# Patient Record
Sex: Female | Born: 1960 | Race: Black or African American | Hispanic: No | Marital: Single | Smoking: Never smoker
Health system: Southern US, Community
[De-identification: ages and names within clinical notes are randomized; demographics above are authoritative.]

## PROBLEM LIST (undated history)

## (undated) DIAGNOSIS — M5134 Other intervertebral disc degeneration, thoracic region: Secondary | ICD-10-CM

## (undated) DIAGNOSIS — D219 Benign neoplasm of connective and other soft tissue, unspecified: Secondary | ICD-10-CM

## (undated) DIAGNOSIS — I10 Essential (primary) hypertension: Secondary | ICD-10-CM

## (undated) DIAGNOSIS — M5124 Other intervertebral disc displacement, thoracic region: Secondary | ICD-10-CM

## (undated) DIAGNOSIS — Z86718 Personal history of other venous thrombosis and embolism: Secondary | ICD-10-CM

## (undated) DIAGNOSIS — G4730 Sleep apnea, unspecified: Secondary | ICD-10-CM

## (undated) DIAGNOSIS — G35 Multiple sclerosis: Secondary | ICD-10-CM

## (undated) HISTORY — DX: Multiple sclerosis: G35

## (undated) HISTORY — DX: Sleep apnea, unspecified: G47.30

## (undated) HISTORY — DX: Personal history of other venous thrombosis and embolism: Z86.718

## (undated) HISTORY — DX: Other intervertebral disc degeneration, thoracic region: M51.34

## (undated) HISTORY — DX: Other intervertebral disc displacement, thoracic region: M51.24

## (undated) HISTORY — DX: Benign neoplasm of connective and other soft tissue, unspecified: D21.9

## (undated) HISTORY — DX: Essential (primary) hypertension: I10

---

## 2007-06-10 ENCOUNTER — Ambulatory Visit: Payer: Self-pay | Admitting: Internal Medicine

## 2011-01-15 DIAGNOSIS — G4733 Obstructive sleep apnea (adult) (pediatric): Secondary | ICD-10-CM | POA: Insufficient documentation

## 2011-04-22 DIAGNOSIS — R809 Proteinuria, unspecified: Secondary | ICD-10-CM | POA: Insufficient documentation

## 2011-05-10 DIAGNOSIS — H524 Presbyopia: Secondary | ICD-10-CM | POA: Insufficient documentation

## 2011-10-27 DIAGNOSIS — E785 Hyperlipidemia, unspecified: Secondary | ICD-10-CM | POA: Insufficient documentation

## 2012-11-20 DIAGNOSIS — R29898 Other symptoms and signs involving the musculoskeletal system: Secondary | ICD-10-CM | POA: Insufficient documentation

## 2013-01-03 DIAGNOSIS — Z86718 Personal history of other venous thrombosis and embolism: Secondary | ICD-10-CM | POA: Insufficient documentation

## 2014-02-12 DIAGNOSIS — Z860100 Personal history of colon polyps, unspecified: Secondary | ICD-10-CM | POA: Insufficient documentation

## 2015-02-08 LAB — HM COLONOSCOPY

## 2015-07-28 DIAGNOSIS — M79605 Pain in left leg: Secondary | ICD-10-CM | POA: Insufficient documentation

## 2015-07-28 DIAGNOSIS — M545 Low back pain: Secondary | ICD-10-CM | POA: Insufficient documentation

## 2015-08-16 HISTORY — PX: COLONOSCOPY: SHX174

## 2017-04-15 DIAGNOSIS — E559 Vitamin D deficiency, unspecified: Secondary | ICD-10-CM | POA: Insufficient documentation

## 2017-04-15 DIAGNOSIS — G35 Multiple sclerosis: Secondary | ICD-10-CM | POA: Insufficient documentation

## 2017-06-23 LAB — HM HEPATITIS C SCREENING LAB: HM Hepatitis Screen: NEGATIVE

## 2017-06-23 LAB — HM HIV SCREENING LAB: HM HIV SCREENING: NEGATIVE

## 2017-09-25 DIAGNOSIS — D500 Iron deficiency anemia secondary to blood loss (chronic): Secondary | ICD-10-CM | POA: Insufficient documentation

## 2017-09-25 DIAGNOSIS — D6859 Other primary thrombophilia: Secondary | ICD-10-CM | POA: Insufficient documentation

## 2017-11-30 DIAGNOSIS — D251 Intramural leiomyoma of uterus: Secondary | ICD-10-CM | POA: Insufficient documentation

## 2018-01-22 ENCOUNTER — Ambulatory Visit: Payer: BC Managed Care – PPO | Admitting: Family Medicine

## 2018-01-22 ENCOUNTER — Encounter

## 2018-01-22 ENCOUNTER — Encounter: Payer: Self-pay | Admitting: Family Medicine

## 2018-01-22 DIAGNOSIS — M5441 Lumbago with sciatica, right side: Secondary | ICD-10-CM | POA: Diagnosis not present

## 2018-01-22 DIAGNOSIS — M5442 Lumbago with sciatica, left side: Secondary | ICD-10-CM | POA: Diagnosis not present

## 2018-01-22 DIAGNOSIS — Z1231 Encounter for screening mammogram for malignant neoplasm of breast: Secondary | ICD-10-CM

## 2018-01-22 DIAGNOSIS — R202 Paresthesia of skin: Secondary | ICD-10-CM | POA: Diagnosis not present

## 2018-01-22 DIAGNOSIS — D500 Iron deficiency anemia secondary to blood loss (chronic): Secondary | ICD-10-CM | POA: Diagnosis not present

## 2018-01-22 DIAGNOSIS — G35 Multiple sclerosis: Secondary | ICD-10-CM

## 2018-01-22 DIAGNOSIS — E559 Vitamin D deficiency, unspecified: Secondary | ICD-10-CM

## 2018-01-22 DIAGNOSIS — R7303 Prediabetes: Secondary | ICD-10-CM | POA: Insufficient documentation

## 2018-01-22 DIAGNOSIS — D6859 Other primary thrombophilia: Secondary | ICD-10-CM

## 2018-01-22 DIAGNOSIS — I10 Essential (primary) hypertension: Secondary | ICD-10-CM | POA: Insufficient documentation

## 2018-01-22 DIAGNOSIS — E785 Hyperlipidemia, unspecified: Secondary | ICD-10-CM

## 2018-01-22 DIAGNOSIS — R739 Hyperglycemia, unspecified: Secondary | ICD-10-CM

## 2018-01-22 DIAGNOSIS — G8929 Other chronic pain: Secondary | ICD-10-CM

## 2018-01-22 DIAGNOSIS — Z6841 Body Mass Index (BMI) 40.0 and over, adult: Secondary | ICD-10-CM

## 2018-01-22 MED ORDER — IBUPROFEN 800 MG PO TABS: 800.0000 mg | ORAL_TABLET | Freq: Three times a day (TID) | ORAL | 0 refills | Status: DC | PRN

## 2018-01-22 MED ORDER — LOSARTAN POTASSIUM 25 MG PO TABS: 25.0000 mg | ORAL_TABLET | Freq: Every day | ORAL | 1 refills | Status: DC

## 2018-01-22 NOTE — Patient Instructions (Signed)
Obesity, Adult  Obesity is the condition of having too much total body fat. Being overweight or obese means that your weight is greater than what is considered healthy for your body size. Obesity is determined by a measurement called BMI. BMI is an estimate of body fat and is calculated from height and weight. For adults, a BMI of 30 or higher is considered obese.  Obesity can eventually lead to other health concerns and major illnesses, including:  · Stroke.  · Coronary artery disease (CAD).  · Type 2 diabetes.  · Some types of cancer, including cancers of the colon, breast, uterus, and gallbladder.  · Osteoarthritis.  · High blood pressure (hypertension).  · High cholesterol.  · Sleep apnea.  · Gallbladder stones.  · Infertility problems.    What are the causes?  The main cause of obesity is taking in (consuming) more calories than your body uses for energy. Other factors that contribute to this condition may include:  · Being born with genes that make you more likely to become obese.  · Having a medical condition that causes obesity. These conditions include:  ? Hypothyroidism.  ? Polycystic ovarian syndrome (PCOS).  ? Binge-eating disorder.  ? Cushing syndrome.  · Taking certain medicines, such as steroids, antidepressants, and seizure medicines.  · Not being physically active (sedentary lifestyle).  · Living where there are limited places to exercise safely or buy healthy foods.  · Not getting enough sleep.    What increases the risk?  The following factors may increase your risk of this condition:  · Having a family history of obesity.  · Being a woman of African-American descent.  · Being a man of Hispanic descent.    What are the signs or symptoms?  Having excessive body fat is the main symptom of this condition.  How is this diagnosed?  This condition may be diagnosed based on:  · Your symptoms.  · Your medical history.  · A physical exam. Your health care provider may measure:  ? Your BMI. If you are an  adult with a BMI between 25 and less than 30, you are considered overweight. If you are an adult with a BMI of 30 or higher, you are considered obese.  ? The distances around your hips and your waist (circumferences). These may be compared to each other to help diagnose your condition.  ? Your skinfold thickness. Your health care provider may gently pinch a fold of your skin and measure it.    How is this treated?  Treatment for this condition often includes changing your lifestyle. Treatment may include some or all of the following:  · Dietary changes. Work with your health care provider and a dietitian to set a weight-loss goal that is healthy and reasonable for you. Dietary changes may include eating:  ? Smaller portions. A portion size is the amount of a particular food that is healthy for you to eat at one time. This varies from person to person.  ? Low-calorie or low-fat options.  ? More whole grains, fruits, and vegetables.  · Regular physical activity. This may include aerobic activity (cardio) and strength training.  · Medicine to help you lose weight. Your health care provider may prescribe medicine if you are unable to lose 1 pound a week after 6 weeks of eating more healthily and doing more physical activity.  · Surgery. Surgical options may include gastric banding and gastric bypass. Surgery may be done if:  ? Other   treatments have not helped to improve your condition.  ? You have a BMI of 40 or higher.  ? You have life-threatening health problems related to obesity.    Follow these instructions at home:    Eating and drinking    · Follow recommendations from your health care provider about what you eat and drink. Your health care provider may advise you to:  ? Limit fast foods, sweets, and processed snack foods.  ? Choose low-fat options, such as low-fat milk instead of whole milk.  ? Eat 5 or more servings of fruits or vegetables every day.  ? Eat at home more often. This gives you more control over  what you eat.  ? Choose healthy foods when you eat out.  ? Learn what a healthy portion size is.  ? Keep low-fat snacks on hand.  ? Avoid sugary drinks, such as soda, fruit juice, iced tea sweetened with sugar, and flavored milk.  ? Eat a healthy breakfast.  · Drink enough water to keep your urine clear or pale yellow.  · Do not go without eating for long periods of time (do not fast) or follow a fad diet. Fasting and fad diets can be unhealthy and even dangerous.  Physical Activity  · Exercise regularly, as told by your health care provider. Ask your health care provider what types of exercise are safe for you and how often you should exercise.  · Warm up and stretch before being active.  · Cool down and stretch after being active.  · Rest between periods of activity.  Lifestyle  · Limit the time that you spend in front of your TV, computer, or video game system.  · Find ways to reward yourself that do not involve food.  · Limit alcohol intake to no more than 1 drink a day for nonpregnant women and 2 drinks a day for men. One drink equals 12 oz of beer, 5 oz of wine, or 1½ oz of hard liquor.  General instructions  · Keep a weight loss journal to keep track of the food you eat and how much you exercise you get.  · Take over-the-counter and prescription medicines only as told by your health care provider.  · Take vitamins and supplements only as told by your health care provider.  · Consider joining a support group. Your health care provider may be able to recommend a support group.  · Keep all follow-up visits as told by your health care provider. This is important.  Contact a health care provider if:  · You are unable to meet your weight loss goal after 6 weeks of dietary and lifestyle changes.  This information is not intended to replace advice given to you by your health care provider. Make sure you discuss any questions you have with your health care provider.  Document Released: 09/08/2004 Document Revised:  01/04/2016 Document Reviewed: 05/20/2015  Elsevier Interactive Patient Education © 2018 Elsevier Inc.

## 2018-01-22 NOTE — Addendum Note (Signed)
Addended by: Steele Sizer F on: 01/22/2018 11:49 AM   Modules accepted: Level of Service

## 2018-01-22 NOTE — Progress Notes (Addendum)
Name: Morgan Craig   MRN: [id number]    DOB: [birth year]   Date:01/22/2018       Progress Note  Subjective  Chief Complaint  Chief Complaint  Patient presents with  . Establish Care    HPI  Pre-diabetes and obesity: she lost 30 lbs last year going to weight watchers, but stopped because of cost and got tired of it. She is currently following intermittent fasting diet. Weight has been stable. She has not been able to exercise because of left lower leg pain/weakness. Discussed portion control. Also discussed medication, she could not tolerate Metformin , discussed GLP-1 agonist ( she denies family history of thyroid cancer or personal history of pancreatitis)   HTN: bp is controlled, taking medication as prescribed, and denies side effects. She has a history of microalbuminuria.   MS: under the care of neurologist and started treatment Dec 2018. She still has pain and tingling on both legs, but worse on the left. Also on duloxetine but does not seem to help. Could not tolerate gabapentin.   Uterine fibroids and iron deficiency anemia; we will recheck labs, she will schedule follow up with gyn, and is due for pap smear.    Patient Active Problem List   Diagnosis Date Noted  . Essential hypertension 01/22/2018  . Morbid obesity with body mass index of 50.0-59.9 in adult ([place]) 01/22/2018  . Pre-diabetes 01/22/2018  . Intramural leiomyoma of uterus 11/30/2017  . Iron deficiency anemia due to chronic blood loss 09/25/2017  . Low protein C activity level ([place]) 09/25/2017  . Multiple sclerosis ([place]) 04/15/2017  . Vitamin D deficiency 04/15/2017  . Low back pain radiating to both legs 07/28/2015  . History of colonic polyps 02/12/2014  . Personal history of other venous thrombosis and embolism 01/03/2013  . Bilateral leg weakness 11/20/2012  . Hyperlipidemia with target LDL less than 100 10/27/2011  . Presbyopia 05/10/2011  . Microalbuminuria 04/22/2011  . Obstructive sleep apnea 01/15/2011     Past Surgical History:  Procedure Laterality Date  . COLONOSCOPY  2017   Found 1 polyp-[place] [facility]    Family History  Problem Relation Age of Onset  . Heart attack Mother   . Ulcers Mother        Legs  . Hypertension Mother   . COPD Father        Tobacco user  . Diabetes Mellitus II Father        Later on in life  . Cataracts Father   . Stroke Sister   . Anxiety disorder Sister   . Drug abuse Brother   . Alcohol abuse Brother   . Hypertension Sister   . Anxiety disorder Sister     Social History   Socioeconomic History  . Marital status: Single    Spouse name: Not on file  . Number of children: 0  . Years of education: Not on file  . Highest education level: Associate degree: occupational, Hotel manager, or vocational program  Occupational History  . Occupation: Arboriculturist Needs  . Financial resource strain: Not hard at all  . Food insecurity:    Worry: Never true    Inability: Never true  . Transportation needs:    Medical: No    Non-medical: No  Tobacco Use  . Smoking status: Never Smoker  . Smokeless tobacco: Never Used  Substance and Sexual Activity  . Alcohol use: Yes    Comment: occasionally  . Drug use: Never  . Sexual activity: Not Currently  Partners: Male  Lifestyle  . Physical activity:    Days per week: 0 days    Minutes per session: 0 min  . Stress: Not at all  Relationships  . Social connections:    Talks on phone: More than three times a week    Gets together: More than three times a week    Attends religious service: Never    Active member of club or organization: No    Attends meetings of clubs or organizations: Never    Relationship status: Never married  . Intimate partner violence:    Fear of current or ex partner: No    Emotionally abused: No    Physically abused: No    Forced sexual activity: No  Other Topics Concern  . Not on file  Social History Narrative   Lives in [place] with her sister     Current  Outpatient Medications:  .  aspirin EC 81 MG tablet, Take 1 tablet by mouth daily., Disp: , Rfl:  .  Cholecalciferol 4000 units CAPS, Take 1 capsule by mouth daily., Disp: , Rfl:  .  DULoxetine (CYMBALTA) 60 MG capsule, Take 1 capsule by mouth daily., Disp: , Rfl:  .  ferrous sulfate 325 (65 FE) MG tablet, Take 325 mg by mouth every other day., Disp: , Rfl:  .  furosemide (LASIX) 20 MG tablet, Take 10 mg by mouth as needed for edema., Disp: , Rfl:  .  losartan (COZAAR) 25 MG tablet, Take 1 tablet by mouth daily., Disp: , Rfl:  .  vitamin E 100 UNIT capsule, Take 1 capsule by mouth daily., Disp: , Rfl:   Allergies  Allergen Reactions  . Gabapentin     Tic disorder  . Metformin And Related     Not sure  . Amoxicillin-Pot Clavulanate Nausea And Vomiting     ROS  Constitutional: Negative for fever or weight change.  Respiratory: Negative for cough and shortness of breath.   Cardiovascular: Negative for chest pain or palpitations.  Gastrointestinal: Negative for abdominal pain, no bowel changes.  Musculoskeletal: Positive  for gait problem but no  joint swelling.  Skin: Negative for rash.  Neurological: Negative for dizziness or headache.  No other specific complaints in a complete review of systems (except as listed in HPI above).  Objective  Vitals:   01/22/18 1037  BP: 104/68  Pulse: 99  Resp: 18  Temp: 98.4 F (36.9 C)  TempSrc: Oral  SpO2: 97%  Weight: (!) 327 lb 12.8 oz (148.7 kg)  Height: 5' 7.03" (1.703 m)    Body mass index is 51.3 kg/m.  Physical Exam   Constitutional: Patient appears well-developed and well-nourished. Obese  No distress.  HEENT: head atraumatic, normocephalic, pupils equal and reactive to light,  neck supple, throat within normal limits Cardiovascular: Normal rate, regular rhythm and normal heart sounds.  No murmur heard. trace   BLE edema. Pulmonary/Chest: Effort normal and breath sounds normal. No respiratory distress. Abdominal: Soft.   There is no tenderness. Psychiatric: Patient has a normal mood and affect. behavior is normal. Judgment and thought content normal. Muscular Skeletal: she favors right leg , negative straight leg raise  PHQ2/9: Depression screen PHQ 2/9 01/22/2018  Decreased Interest 0  Down, Depressed, Hopeless 0  PHQ - 2 Score 0  Altered sleeping 0  Tired, decreased energy 1  Change in appetite 0  Feeling bad or failure about yourself  0  Trouble concentrating 0  Moving slowly or fidgety/restless 0  Suicidal thoughts 0  PHQ-9 Score 1  Difficult doing work/chores Not difficult at all     Fall Risk: Fall Risk  01/22/2018  Falls in the past year? Yes  Number falls in past yr: 2 or more  Injury with Fall? Yes  Comment Back cut her self on a jagged edge     Functional Status Survey: Is the patient deaf or have difficulty hearing?: Yes Does the patient have difficulty seeing, even when wearing glasses/contacts?: Yes(reading glasses) Does the patient have difficulty concentrating, remembering, or making decisions?: No Does the patient have difficulty walking or climbing stairs?: Yes Does the patient have difficulty dressing or bathing?: Yes(lifting her left leg) Does the patient have difficulty doing errands alone such as visiting a doctor's office or shopping?: No    Assessment & Plan  1. Morbid obesity ([place])  Discussed with the patient the risk posed by an increased BMI. Discussed importance of portion control, calorie counting and at least 150 minutes of physical activity weekly. Avoid sweet beverages and drink more water. Eat at least 6 servings of fruit and vegetables daily   2. Multiple sclerosis ([place])  Seeing neurologist and getting injections every 6 months  3. Breast cancer screening by mammogram  - MM DIGITAL SCREENING BILATERAL; Future  4. Low protein C activity level ([place])  Seen by hematologist, she had a DVT in the past, but not on anti-coagulation, discussed seeing  hematologist again but she states she refuses going back on blood thinners  5. Essential hypertension  - COMPLETE METABOLIC PANEL WITH GFR  6. Hyperlipidemia with target LDL less than 100  - Lipid panel  7. Iron deficiency anemia due to chronic blood loss  She has a history of uterine fibroids - CBC with Differential/Platelet - Iron, TIBC and Ferritin Panel; Future  8. Hyperglycemia  - Hemoglobin A1c  9. Paresthesia of both legs  - Vitamin B12  10. Vitamin D deficiency  - VITAMIN D 25 Hydroxy (Vit-D Deficiency, Fractures)  11. Chronic midline low back pain with bilateral sciatica  - ibuprofen (ADVIL,MOTRIN) 800 MG tablet; Take 1 tablet (800 mg total) by mouth every 8 (eight) hours as needed.  Dispense: 30 tablet; Refill: 0

## 2018-01-23 LAB — LIPID PANEL
CHOL/HDL RATIO: 4.1 (calc) (ref <5.0)
Cholesterol: 182 mg/dL (ref <200)
HDL: 44 mg/dL — ABNORMAL LOW (ref >50)
LDL CHOLESTEROL (CALC): 117 mg/dL — AB
NON-HDL CHOLESTEROL (CALC): 138 mg/dL — AB (ref <130)
TRIGLYCERIDES: 100 mg/dL (ref <150)

## 2018-01-23 LAB — COMPLETE METABOLIC PANEL WITH GFR
AG Ratio: 1.3 (calc) (ref 1.0–2.5)
ALBUMIN MSPROF: 3.9 g/dL (ref 3.6–5.1)
ALKALINE PHOSPHATASE (APISO): 95 U/L (ref 33–130)
ALT: 9 U/L (ref 6–29)
AST: 12 U/L (ref 10–35)
BILIRUBIN TOTAL: 0.5 mg/dL (ref 0.2–1.2)
BUN: 15 mg/dL (ref 7–25)
CHLORIDE: 105 mmol/L (ref 98–110)
CO2: 26 mmol/L (ref 20–32)
Calcium: 8.7 mg/dL (ref 8.6–10.4)
Creat: 0.9 mg/dL (ref 0.50–1.05)
GFR, Est African American: 84 mL/min/{1.73_m2} (ref >=60)
GFR, Est Non African American: 72 mL/min/{1.73_m2} (ref >=60)
GLUCOSE: 93 mg/dL (ref 65–99)
Globulin: 2.9 g/dL (calc) (ref 1.9–3.7)
Potassium: 4.2 mmol/L (ref 3.5–5.3)
Sodium: 138 mmol/L (ref 135–146)
Total Protein: 6.8 g/dL (ref 6.1–8.1)

## 2018-01-23 LAB — HEMOGLOBIN A1C
HEMOGLOBIN A1C: 5.5 %{Hb} (ref <5.7)
MEAN PLASMA GLUCOSE: 111 (calc)
eAG (mmol/L): 6.2 (calc)

## 2018-01-23 LAB — CBC WITH DIFFERENTIAL/PLATELET
BASOS PCT: 0.6 %
Basophils Absolute: 40 cells/uL (ref 0–200)
EOS ABS: 101 {cells}/uL (ref 15–500)
Eosinophils Relative: 1.5 %
HEMATOCRIT: 35.2 % (ref 35.0–45.0)
HEMOGLOBIN: 10.9 g/dL — AB (ref 11.7–15.5)
LYMPHS ABS: 1126 {cells}/uL (ref 850–3900)
MCH: 22.7 pg — AB (ref 27.0–33.0)
MCHC: 31 g/dL — ABNORMAL LOW (ref 32.0–36.0)
MCV: 73.2 fL — AB (ref 80.0–100.0)
MPV: 10.8 fL (ref 7.5–12.5)
Monocytes Relative: 8.3 %
Neutro Abs: 4878 cells/uL (ref 1500–7800)
Neutrophils Relative %: 72.8 %
Platelets: 376 10*3/uL (ref 140–400)
RBC: 4.81 10*6/uL (ref 3.80–5.10)
RDW: 18.8 % — ABNORMAL HIGH (ref 11.0–15.0)
Total Lymphocyte: 16.8 %
WBC: 6.7 10*3/uL (ref 3.8–10.8)
WBCMIX: 556 {cells}/uL (ref 200–950)

## 2018-01-23 LAB — VITAMIN D 25 HYDROXY (VIT D DEFICIENCY, FRACTURES): Vit D, 25-Hydroxy: 38 ng/mL (ref 30–100)

## 2018-01-23 LAB — VITAMIN B12: VITAMIN B 12: 1367 pg/mL — AB (ref 200–1100)

## 2018-02-28 LAB — HM MAMMOGRAPHY

## 2018-03-01 ENCOUNTER — Encounter: Payer: Self-pay | Admitting: Family Medicine

## 2018-04-23 ENCOUNTER — Encounter: Payer: Self-pay | Admitting: Family Medicine

## 2018-05-03 ENCOUNTER — Ambulatory Visit: Payer: BC Managed Care – PPO | Admitting: Family Medicine

## 2018-05-03 ENCOUNTER — Encounter: Payer: Self-pay | Admitting: Family Medicine

## 2018-05-03 VITALS — BP 124/82 | HR 93 | Temp 98.2°F | Resp 16 | Ht 67.0 in | Wt 328.1 lb

## 2018-05-03 DIAGNOSIS — M4312 Spondylolisthesis, cervical region: Secondary | ICD-10-CM | POA: Diagnosis not present

## 2018-05-03 DIAGNOSIS — Z23 Encounter for immunization: Secondary | ICD-10-CM | POA: Diagnosis not present

## 2018-05-03 DIAGNOSIS — G35 Multiple sclerosis: Secondary | ICD-10-CM

## 2018-05-03 DIAGNOSIS — D6859 Other primary thrombophilia: Secondary | ICD-10-CM | POA: Diagnosis not present

## 2018-05-03 DIAGNOSIS — R202 Paresthesia of skin: Secondary | ICD-10-CM

## 2018-05-03 DIAGNOSIS — Z86718 Personal history of other venous thrombosis and embolism: Secondary | ICD-10-CM | POA: Diagnosis not present

## 2018-05-03 DIAGNOSIS — I10 Essential (primary) hypertension: Secondary | ICD-10-CM

## 2018-05-03 NOTE — Progress Notes (Signed)
Name: Morgan Craig   MRN: [id number]    DOB: April 21, 1961   Date:05/03/2018       Progress Note  Subjective  Chief Complaint  Chief Complaint  Patient presents with  . Medical Clearance    patient will be having a posterior cervical decompression/ fusion @ [facility] (Dr. Haynes Kerns)    HPI  Pre-op: low risk procedure in a high risk patient. She has MS, history of HTN, prediabetes, Obesity and previous history of DVT of left leg with low protein C activity. She is compliant with medications. Last infusion of Ocrevus with prednisone was June, next dose in December. She denies reaction to anesthesia in the past ( had colonoscopy), no dentures or false teeth, She denies cough, wheezing or decrease in exercise tolerance. She is not very active because of balance problems, but was active before the paresthesia and balance problems. BP is at goal, may stop aspirin 7 days prior to surgery, but needs to consider perioperative anticoagulation because of history of DVT   Patient Active Problem List   Diagnosis Date Noted  . Essential hypertension 01/22/2018  . Morbid obesity with body mass index of 50.0-59.9 in adult ([place]) 01/22/2018  . Pre-diabetes 01/22/2018  . Intramural leiomyoma of uterus 11/30/2017  . Iron deficiency anemia due to chronic blood loss 09/25/2017  . Low protein C activity level ([place]) 09/25/2017  . Multiple sclerosis ([place]) 04/15/2017  . Vitamin D deficiency 04/15/2017  . Low back pain radiating to both legs 07/28/2015  . History of colonic polyps 02/12/2014  . Personal history of other venous thrombosis and embolism 01/03/2013  . Bilateral leg weakness 11/20/2012  . Hyperlipidemia with target LDL less than 100 10/27/2011  . Presbyopia 05/10/2011  . Microalbuminuria 04/22/2011  . Obstructive sleep apnea 01/15/2011    Past Surgical History:  Procedure Laterality Date  . COLONOSCOPY  2017   Found 1 polyp-[place] [facility]    Family History  Problem Relation Age of Onset  . Heart  attack Mother   . Ulcers Mother        Legs  . Hypertension Mother   . COPD Father        Tobacco user  . Diabetes Mellitus II Father        Later on in life  . Cataracts Father   . Stroke Sister   . Anxiety disorder Sister   . Drug abuse Brother   . Alcohol abuse Brother   . Hypertension Sister   . Anxiety disorder Sister     Social History   Socioeconomic History  . Marital status: Single    Spouse name: Not on file  . Number of children: 0  . Years of education: Not on file  . Highest education level: Associate degree: occupational, Hotel manager, or vocational program  Occupational History  . Occupation: Arboriculturist Needs  . Financial resource strain: Not hard at all  . Food insecurity:    Worry: Never true    Inability: Never true  . Transportation needs:    Medical: No    Non-medical: No  Tobacco Use  . Smoking status: Never Smoker  . Smokeless tobacco: Never Used  Substance and Sexual Activity  . Alcohol use: Yes    Comment: occasionally  . Drug use: Never  . Sexual activity: Not Currently    Partners: Male  Lifestyle  . Physical activity:    Days per week: 0 days    Minutes per session: 0 min  . Stress:  Not at all  Relationships  . Social connections:    Talks on phone: More than three times a week    Gets together: More than three times a week    Attends religious service: Never    Active member of club or organization: No    Attends meetings of clubs or organizations: Never    Relationship status: Never married  . Intimate partner violence:    Fear of current or ex partner: No    Emotionally abused: No    Physically abused: No    Forced sexual activity: No  Other Topics Concern  . Not on file  Social History Narrative   Lives in [place] with her sister     Current Outpatient Medications:  .  acetaminophen (TYLENOL) 500 MG tablet, Take 500 mg by mouth every 6 (six) hours as needed., Disp: , Rfl:  .  aspirin EC 81 MG tablet, Take 1  tablet by mouth daily., Disp: , Rfl:  .  furosemide (LASIX) 20 MG tablet, Take 10 mg by mouth as needed for edema., Disp: , Rfl:  .  ibuprofen (ADVIL,MOTRIN) 800 MG tablet, Take 1 tablet (800 mg total) by mouth every 8 (eight) hours as needed., Disp: 30 tablet, Rfl: 0 .  losartan (COZAAR) 25 MG tablet, Take 1 tablet (25 mg total) by mouth daily., Disp: 90 tablet, Rfl: 1  Allergies  Allergen Reactions  . Gabapentin     Tic disorder  . Metformin And Related     Not sure  . Amoxicillin-Pot Clavulanate Nausea And Vomiting    I personally reviewed active problem list, medication list, allergies, family history, social history with the patient/caregiver today.   ROS  Constitutional: Negative for fever or weight change.  Respiratory: Negative for cough and shortness of breath.   Cardiovascular: Negative for chest pain or palpitations.  Gastrointestinal: Negative for abdominal pain, no bowel changes.  Musculoskeletal: Negative for gait problem or joint swelling.  Skin: Negative for rash.  Neurological: Negative for dizziness or headache.  No other specific complaints in a complete review of systems (except as listed in HPI above).  Objective  Vitals:   05/03/18 1124  BP: 124/82  Pulse: 93  Resp: 16  Temp: 98.2 F (36.8 C)  TempSrc: Oral  SpO2: 96%  Weight: (!) 328 lb 1.6 oz (148.8 kg)  Height: 5\' 7"  (1.702 m)    Body mass index is 51.39 kg/m.  Physical Exam  Constitutional: Patient appears well-developed and well-nourished. Obese  No distress.  HEENT: head atraumatic, normocephalic, pupils equal and reactive to light,  neck supple, throat within normal limits Cardiovascular: Normal rate, regular rhythm and normal heart sounds.  No murmur heard. No BLE edema. Pulmonary/Chest: Effort normal and breath sounds normal. No respiratory distress. Abdominal: Soft.  There is no tenderness. Muscular skeletal; normal rom of neck, negative straight leg raise, no pain during palpation  of lumbar spine, she walks slowly  Psychiatric: Patient has a normal mood and affect. behavior is normal. Judgment and thought content normal.   Recent Results (from the past 2160 hour(s))  HM MAMMOGRAPHY     Status: None   Collection Time: 02/28/18 12:00 AM  Result Value Ref Range   HM Mammogram 0-4 Bi-Rad 0-4 Bi-Rad, Self Reported Normal     PHQ2/9: Depression screen [facility] 2/9 05/03/2018 01/22/2018  Decreased Interest 0 0  Down, Depressed, Hopeless 0 0  PHQ - 2 Score 0 0  Altered sleeping 0 0  Tired, decreased energy 0  1  Change in appetite 0 0  Feeling bad or failure about yourself  0 0  Trouble concentrating 0 0  Moving slowly or fidgety/restless 0 0  Suicidal thoughts 0 0  PHQ-9 Score 0 1  Difficult doing work/chores Not difficult at all Not difficult at all     Fall Risk: Fall Risk  05/03/2018 01/22/2018  Falls in the past year? No Yes  Number falls in past yr: - 2 or more  Injury with Fall? - Yes  Comment - Back cut her self on a jagged edge     Functional Status Survey: Is the patient deaf or have difficulty hearing?: No Does the patient have difficulty seeing, even when wearing glasses/contacts?: No Does the patient have difficulty concentrating, remembering, or making decisions?: No Does the patient have difficulty walking or climbing stairs?: Yes Does the patient have difficulty dressing or bathing?: No Does the patient have difficulty doing errands alone such as visiting a doctor's office or shopping?: No    Assessment & Plan  1. Spondylolisthesis of cervical region  May proceed to surgery as requested by Dr. Wynn Banker, take bp medication morning of surgery, hold aspirin the week prior to surgery   2. Paresthesia of bilateral legs   3. Paresthesia of left arm   4. Low protein C activity level ([facility])   5. History of deep venous thrombosis (DVT) of distal vein of left lower extremity   6. Multiple sclerosis ([place])  Up to date with neurologist visit    7. Morbid obesity ([place])  Discussed with the patient the risk posed by an increased BMI. Discussed importance of portion control, calorie counting and at least 150 minutes of physical activity weekly. Avoid sweet beverages and drink more water. Eat at least 6 servings of fruit and vegetables daily   8. Essential hypertension  At goal   9. Needs flu shot  - Flu Vaccine QUAD 6+ mos PF IM (Fluarix Quad PF)

## 2018-05-21 DIAGNOSIS — Z981 Arthrodesis status: Secondary | ICD-10-CM | POA: Insufficient documentation

## 2018-05-21 DIAGNOSIS — G9589 Other specified diseases of spinal cord: Secondary | ICD-10-CM | POA: Insufficient documentation

## 2018-05-23 MED ORDER — MORPHINE SULFATE 2 MG/ML IJ SOLN
2.00 | INTRAMUSCULAR | Status: DC
Start: ? — End: 2018-05-23

## 2018-05-23 MED ORDER — ACETAMINOPHEN 325 MG PO TABS
650.00 | ORAL_TABLET | ORAL | Status: DC
Start: 2018-05-23 — End: 2018-05-23

## 2018-05-23 MED ORDER — GENERIC EXTERNAL MEDICATION
2.00 | Status: DC
Start: 2018-05-23 — End: 2018-05-23

## 2018-05-23 MED ORDER — LACTATED RINGERS IV SOLN
100.00 | INTRAVENOUS | Status: DC
Start: ? — End: 2018-05-23

## 2018-05-23 MED ORDER — PREGABALIN 75 MG PO CAPS
75.00 | ORAL_CAPSULE | ORAL | Status: DC
Start: 2018-05-23 — End: 2018-05-23

## 2018-05-23 MED ORDER — CELECOXIB 200 MG PO CAPS
200.00 | ORAL_CAPSULE | ORAL | Status: DC
Start: 2018-05-24 — End: 2018-05-23

## 2018-05-23 MED ORDER — GENERIC EXTERNAL MEDICATION
5.00 | Status: DC
Start: ? — End: 2018-05-23

## 2018-05-23 MED ORDER — POLYETHYLENE GLYCOL 3350 17 G PO PACK
17.00 | PACK | ORAL | Status: DC
Start: 2018-05-24 — End: 2018-05-23

## 2018-05-23 MED ORDER — NALOXONE HCL 4 MG/10ML IJ SOLN
0.10 | INTRAMUSCULAR | Status: DC
Start: ? — End: 2018-05-23

## 2018-05-23 MED ORDER — DOCUSATE SODIUM 100 MG PO CAPS
100.00 | ORAL_CAPSULE | ORAL | Status: DC
Start: 2018-05-23 — End: 2018-05-23

## 2018-05-23 MED ORDER — PHENOL 1.4 % MT LIQD
2.00 | OROMUCOSAL | Status: DC
Start: ? — End: 2018-05-23

## 2018-05-23 MED ORDER — MENTHOL 9.1 MG MT LOZG
1.00 | LOZENGE | OROMUCOSAL | Status: DC
Start: ? — End: 2018-05-23

## 2018-05-23 MED ORDER — ONDANSETRON HCL 4 MG/2ML IJ SOLN
4.00 | INTRAMUSCULAR | Status: DC
Start: ? — End: 2018-05-23

## 2018-05-23 MED ORDER — DIPHENHYDRAMINE HCL 25 MG PO CAPS
25.00 | ORAL_CAPSULE | ORAL | Status: DC
Start: ? — End: 2018-05-23

## 2018-05-23 MED ORDER — LOSARTAN POTASSIUM 25 MG PO TABS
25.00 | ORAL_TABLET | ORAL | Status: DC
Start: 2018-05-24 — End: 2018-05-23

## 2018-06-22 ENCOUNTER — Telehealth: Payer: Self-pay

## 2018-06-22 DIAGNOSIS — N179 Acute kidney failure, unspecified: Secondary | ICD-10-CM | POA: Insufficient documentation

## 2018-06-22 NOTE — Telephone Encounter (Signed)
Copied from [place] [id number]. Topic: Appointment Scheduling - Scheduling Inquiry for Clinic >> Jun 22, 2018  7:50 AM Conception Chancy, NT wrote: Reason for CRM: Candi Leash is calling from [facility] hospital to schedule patient a 1 week hospital fu. She states she will be released today or over the weekend. Dr. Ancil Boozer does not have anything available. Please contact them to schedule  Cb# [phone] [place] >> Jun 22, 2018  8:17 AM Karene Fry P wrote: Please advise as to where I can schedule this appt

## 2018-06-25 NOTE — Telephone Encounter (Signed)
What about Thursday

## 2018-06-26 MED ORDER — GENERIC EXTERNAL MEDICATION
1.00 | Status: DC
Start: 2018-06-26 — End: 2018-06-26

## 2018-06-26 MED ORDER — WARFARIN SODIUM 6 MG PO TABS
6.00 | ORAL_TABLET | ORAL | Status: DC
Start: 2018-06-26 — End: 2018-06-26

## 2018-06-26 MED ORDER — ALBUTEROL SULFATE (2.5 MG/3ML) 0.083% IN NEBU
2.50 | INHALATION_SOLUTION | RESPIRATORY_TRACT | Status: DC
Start: ? — End: 2018-06-26

## 2018-06-26 MED ORDER — MAGNESIUM HYDROXIDE 400 MG/5ML PO SUSP
30.00 | ORAL | Status: DC
Start: ? — End: 2018-06-26

## 2018-06-26 MED ORDER — POLYETHYLENE GLYCOL 3350 17 G PO PACK
17.00 | PACK | ORAL | Status: DC
Start: ? — End: 2018-06-26

## 2018-06-26 MED ORDER — ACETAMINOPHEN 500 MG PO TABS
1000.00 | ORAL_TABLET | ORAL | Status: DC
Start: ? — End: 2018-06-26

## 2018-06-26 MED ORDER — IPRATROPIUM BROMIDE 0.02 % IN SOLN
500.00 | RESPIRATORY_TRACT | Status: DC
Start: ? — End: 2018-06-26

## 2018-06-26 NOTE — Telephone Encounter (Signed)
Called pt to get her scheduled for HFU, pt states she is going to rehab after being released from hosp. She will call once she knows she is able to come in.

## 2018-06-29 ENCOUNTER — Telehealth: Payer: Self-pay

## 2018-06-29 NOTE — Telephone Encounter (Signed)
Copied from [place] [phone]. Topic: General - Other >> Jun 29, 2018 11:12 AM Burchel, Abbi R wrote: Pt requesting to have documentation of her flu shot administration to her employer.  Please fax to:   [place] [phone]   Information has been faxed as requested.

## 2018-07-04 ENCOUNTER — Telehealth: Payer: Self-pay | Admitting: Family Medicine

## 2018-07-04 NOTE — Telephone Encounter (Signed)
Copied from [place] [phone]. Topic: General - Other >> Jul 04, 2018 11:40 AM Lennox Solders wrote: Delene Loll for CRM: Desha bcbs case manager is calling the pt was admitted [facility] on  06-20-18 due to PE and pt was discharge on 06-26-18 to   [facility].

## 2018-07-04 NOTE — Telephone Encounter (Signed)
Copied from [place] [phone]. Topic: General - Other >> Jul 04, 2018  8:21 AM Carolyn Stare wrote:  Pt need a hosp/rehab fup Please call to schedule >> Jul 04, 2018  9:48 AM Karene Fry P wrote: Please help me find a spot to put this hospital fu on Dr Ancil Boozer schedule

## 2018-07-05 ENCOUNTER — Telehealth: Payer: Self-pay | Admitting: Family Medicine

## 2018-07-05 NOTE — Telephone Encounter (Signed)
Copied from [place] [id number]. Topic: Quick Communication - Home Health Verbal Orders >> Jul 05, 2018 10:45 AM Berneta Levins wrote: Caller/Agency: Ernestine with [facility] Number: [phone], OK to leave a message Requesting OT/PT/Skilled Nursing/Social Work: you have received home health orders from [facility], they need to make sure that Dr. Ancil Boozer is willing to follow pt and sign any additional orders

## 2018-07-05 NOTE — Telephone Encounter (Signed)
Truly it can be any time , cannot bill after 14 days as a hospital follow up, but it will still be a hospital follow up

## 2018-07-05 NOTE — Telephone Encounter (Signed)
Went to schedule hosp fu and found out by the patient and her chart that she was discharged from [facility] on Nov 12 and is in [place] and will not be discharged till Nov 23, 19. Per Kristeen Miss we can make her a regular follow up appt will be past time for a true Hosp follow up.

## 2018-07-05 NOTE — Telephone Encounter (Signed)
It can be done within 14 days of discharge

## 2018-07-05 NOTE — Telephone Encounter (Signed)
yes

## 2018-07-06 NOTE — Telephone Encounter (Signed)
Spoke with Ernestine with [facility] and gave her verbal ok for additional orders with home health.

## 2018-07-06 NOTE — Telephone Encounter (Signed)
Pt already has an appt the first week in Dec. Told her to keep that one

## 2018-07-07 LAB — CBC AND DIFFERENTIAL
HEMATOCRIT: 27 — AB (ref 36–46)
HEMOGLOBIN: 8.4 — AB (ref 12.0–16.0)

## 2018-07-07 LAB — PROTIME-INR: Protime: 21.9 — AB (ref 10.0–13.8)

## 2018-07-11 ENCOUNTER — Encounter: Payer: Self-pay | Admitting: Family Medicine

## 2018-07-19 DIAGNOSIS — I2602 Saddle embolus of pulmonary artery with acute cor pulmonale: Secondary | ICD-10-CM | POA: Insufficient documentation

## 2018-07-24 ENCOUNTER — Encounter: Payer: Self-pay | Admitting: Family Medicine

## 2018-07-24 ENCOUNTER — Ambulatory Visit: Payer: BC Managed Care – PPO | Admitting: Family Medicine

## 2018-07-24 VITALS — BP 110/72 | HR 95 | Temp 98.0°F | Resp 16 | Ht 67.0 in | Wt 325.3 lb

## 2018-07-24 DIAGNOSIS — Z9889 Other specified postprocedural states: Secondary | ICD-10-CM

## 2018-07-24 DIAGNOSIS — I2782 Chronic pulmonary embolism: Secondary | ICD-10-CM | POA: Insufficient documentation

## 2018-07-24 DIAGNOSIS — I2692 Saddle embolus of pulmonary artery without acute cor pulmonale: Secondary | ICD-10-CM

## 2018-07-24 DIAGNOSIS — D6859 Other primary thrombophilia: Secondary | ICD-10-CM

## 2018-07-24 DIAGNOSIS — D500 Iron deficiency anemia secondary to blood loss (chronic): Secondary | ICD-10-CM

## 2018-07-24 DIAGNOSIS — I10 Essential (primary) hypertension: Secondary | ICD-10-CM | POA: Diagnosis not present

## 2018-07-24 DIAGNOSIS — I2699 Other pulmonary embolism without acute cor pulmonale: Secondary | ICD-10-CM

## 2018-07-24 DIAGNOSIS — Z7901 Long term (current) use of anticoagulants: Secondary | ICD-10-CM

## 2018-07-24 DIAGNOSIS — Z86718 Personal history of other venous thrombosis and embolism: Secondary | ICD-10-CM

## 2018-07-24 DIAGNOSIS — E559 Vitamin D deficiency, unspecified: Secondary | ICD-10-CM

## 2018-07-24 DIAGNOSIS — Z5181 Encounter for therapeutic drug level monitoring: Secondary | ICD-10-CM

## 2018-07-24 MED ORDER — FERROUS SULFATE 325 (65 FE) MG PO TABS: 325.0000 mg | ORAL_TABLET | Freq: Every day | ORAL | 0 refills | Status: DC

## 2018-07-24 NOTE — Progress Notes (Signed)
Name: Morgan Craig   MRN: [id number]    DOB: 10-16-1960   Date:07/24/2018       Progress Note  Subjective  Chief Complaint  Chief Complaint  Patient presents with  . Hypertension  . Hyperlipidemia  . Prediabetes    HPI  Pre-diabetes and obesity: she lost 30 lbs last year going to weight watchers, but stopped because of cost and got tired of it. Currently eating only one meal a day, lost 3 lbs since last visit. She will go back to weight watchers  HTN: bp is controlled, taking medication as prescribed, and denies side effects. BP is at goal today . She states at home, PT states bp was 140/100   MS: under the care of neurologist and started treatment Dec 2018. She still has pain and tingling on both legs, but worse on the left. Duloxetine did not work and gabapentin caused side effects. Uses a cane to help with balance  History of neck fusion: on 05/21/2018, getting PT, still uses a cane outside the home, but doing better, had complications after surgery, developed PE - saddle and pulmonary hypertension. Went to [facility] with headache. She was admitted and after that went to rehab facility, she has been home for the past couple of weeks. She still has some low back pain and still has some paresthesia but states left leg not was heavy now. Seen by hematologist and is going to be on coumadin for 6 months, she has follow up with them in 3 months, advised to choose coming here to monitor INR or clinic at [facility] in [facility] and it is too difficulty for her to go there   Uterine fibroids and iron deficiency anemia; return for pap smear, needs to see gyn, but she prefers waiting until menopause.   Iron deficiency anemia: last hgb was below 9, only taking otc ferrous sulfate once a day, advised to increase to at least twice daily and take colace to avoid constipation.   Patient Active Problem List   Diagnosis Date Noted  . Pulmonary embolism ([place]) 07/24/2018  . History of spinal fusion 05/21/2018  .  Essential hypertension 01/22/2018  . Morbid obesity with body mass index of 50.0-59.9 in adult ([place]) 01/22/2018  . Pre-diabetes 01/22/2018  . Intramural leiomyoma of uterus 11/30/2017  . Iron deficiency anemia due to chronic blood loss 09/25/2017  . Low protein C activity level ([place]) 09/25/2017  . Multiple sclerosis ([place]) 04/15/2017  . Vitamin D deficiency 04/15/2017  . Low back pain radiating to both legs 07/28/2015  . History of colonic polyps 02/12/2014  . Personal history of other venous thrombosis and embolism 01/03/2013  . Bilateral leg weakness 11/20/2012  . Hyperlipidemia with target LDL less than 100 10/27/2011  . Presbyopia 05/10/2011  . Microalbuminuria 04/22/2011  . Obstructive sleep apnea 01/15/2011    Past Surgical History:  Procedure Laterality Date  . COLONOSCOPY  2017   Found 1 polyp-[place]    Family History  Problem Relation Age of Onset  . Heart attack Mother   . Ulcers Mother        Legs  . Hypertension Mother   . COPD Father        Tobacco user  . Diabetes Mellitus II Father        Later on in life  . Cataracts Father   . Stroke Sister   . Anxiety disorder Sister   . Drug abuse Brother   . Alcohol abuse Brother   . Hypertension  Sister   . Anxiety disorder Sister     Social History   Socioeconomic History  . Marital status: Single    Spouse name: Not on file  . Number of children: 0  . Years of education: Not on file  . Highest education level: Associate degree: occupational, Hotel manager, or vocational program  Occupational History  . Occupation: Arboriculturist Needs  . Financial resource strain: Not hard at all  . Food insecurity:    Worry: Never true    Inability: Never true  . Transportation needs:    Medical: No    Non-medical: No  Tobacco Use  . Smoking status: Never Smoker  . Smokeless tobacco: Never Used  Substance and Sexual Activity  . Alcohol use: Yes    Comment: occasionally  . Drug use: Never  . Sexual  activity: Not Currently    Partners: Male  Lifestyle  . Physical activity:    Days per week: 0 days    Minutes per session: 0 min  . Stress: Not at all  Relationships  . Social connections:    Talks on phone: More than three times a week    Gets together: More than three times a week    Attends religious service: Never    Active member of club or organization: No    Attends meetings of clubs or organizations: Never    Relationship status: Never married  . Intimate partner violence:    Fear of current or ex partner: No    Emotionally abused: No    Physically abused: No    Forced sexual activity: No  Other Topics Concern  . Not on file  Social History Narrative   Lives in [place] with her sister     Current Outpatient Medications:  .  acetaminophen (TYLENOL) 500 MG tablet, Take 500 mg by mouth every 6 (six) hours as needed., Disp: , Rfl:  .  furosemide (LASIX) 20 MG tablet, Take 10 mg by mouth as needed for edema., Disp: , Rfl:  .  losartan (COZAAR) 25 MG tablet, Take 1 tablet (25 mg total) by mouth daily., Disp: 90 tablet, Rfl: 1 .  warfarin (COUMADIN) 6 MG tablet, Take 1 tablet by mouth daily., Disp: , Rfl:   Allergies  Allergen Reactions  . Gabapentin     Tic disorder  . Metformin And Related     Not sure  . Amoxicillin-Pot Clavulanate Nausea And Vomiting    I personally reviewed active problem list, medication list, allergies, family history, social history with the patient/caregiver today.   ROS  Constitutional: Negative for fever or weight change.  Respiratory: Negative for cough and shortness of breath.   Cardiovascular: Negative for chest pain or palpitations.  Gastrointestinal: Negative for abdominal pain, no bowel changes.  Musculoskeletal: Positive  for gait problem but no joint swelling.  Skin: Negative for rash.  Neurological: Negative for dizziness or headache.  No other specific complaints in a complete review of systems (except as listed in HPI  above).  Objective  Vitals:   07/24/18 1008  BP: 110/72  Pulse: 95  Resp: 16  Temp: 98 F (36.7 C)  TempSrc: Oral  SpO2: 99%  Weight: (!) 325 lb 4.8 oz (147.6 kg)  Height: 5\' 7"  (1.702 m)    Body mass index is 50.95 kg/m.  Physical Exam  Constitutional: Patient appears well-developed and well-nourished. Obese  No distress.  HEENT: head atraumatic, normocephalic, pupils equal and reactive to light, neck supple, throat within  normal limits Cardiovascular: Normal rate, regular rhythm and normal heart sounds.  No murmur heard. No BLE edema. Pulmonary/Chest: Effort normal and breath sounds normal. No respiratory distress. Neurological: uses cane for balance, prior to neck surgery  Abdominal: Soft.  There is no tenderness. Psychiatric: Patient has a normal mood and affect. behavior is normal. Judgment and thought content normal.  Recent Results (from the past 2160 hour(s))  CBC and differential     Status: Abnormal   Collection Time: 07/07/18 12:00 AM  Result Value Ref Range   Hemoglobin 8.4 (A) 12.0 - 16.0   HCT 27 (A) 36 - 46  Protime-INR     Status: Abnormal   Collection Time: 07/07/18 12:00 AM  Result Value Ref Range   Protime 21.9 (A) 10.0 - 13.8    Comment: INR-2.0     PHQ2/9: Depression screen [facility] 2/9 05/03/2018 01/22/2018  Decreased Interest 0 0  Down, Depressed, Hopeless 0 0  PHQ - 2 Score 0 0  Altered sleeping 0 0  Tired, decreased energy 0 1  Change in appetite 0 0  Feeling bad or failure about yourself  0 0  Trouble concentrating 0 0  Moving slowly or fidgety/restless 0 0  Suicidal thoughts 0 0  PHQ-9 Score 0 1  Difficult doing work/chores Not difficult at all Not difficult at all     Fall Risk: Fall Risk  07/24/2018 05/03/2018 01/22/2018  Falls in the past year? 0 No Yes  Number falls in past yr: - - 2 or more  Injury with Fall? - - Yes  Comment - - Back cut her self on a jagged edge    Assessment & Plan   1. 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 Personal history of other venous thrombosis and embolism  One DVT in the past, and PE after neck surgery 05/2018, symptoms of PE 06/2018  3. Low protein C activity level ([place])  Under the care of hematologist  4. Essential hypertension  bp is at goal   5. Morbid obesity ([place])  Discussed with the patient the risk posed by an increased BMI. Discussed importance of portion control, calorie counting and at least 150 minutes of physical activity weekly. Avoid sweet beverages and drink more water. Eat at least 6 servings of fruit and vegetables daily   6. Vitamin D deficiency  Continue supplementation   7. History of neck surgery  Still getting PT  8. Encounter for monitoring Coumadin therapy  Last INR at goal at 2.65 , and she will return in 2 weeks for recheck

## 2018-08-06 ENCOUNTER — Encounter: Payer: Self-pay | Admitting: Family Medicine

## 2018-08-06 ENCOUNTER — Ambulatory Visit: Payer: BC Managed Care – PPO | Admitting: Family Medicine

## 2018-08-06 VITALS — BP 114/80 | HR 82 | Temp 97.9°F | Resp 16 | Ht 67.0 in | Wt 321.5 lb

## 2018-08-06 DIAGNOSIS — I2692 Saddle embolus of pulmonary artery without acute cor pulmonale: Secondary | ICD-10-CM

## 2018-08-06 LAB — POCT INR: INR: 2.6 (ref 2.0–3.0)

## 2018-08-06 NOTE — Progress Notes (Signed)
Name: Morgan Craig   MRN: [id number]    DOB: Jun 16, 1961   Date:08/06/2018       Progress Note  Subjective  Chief Complaint  Chief Complaint  Patient presents with  . Anticoagulation    HPI  History of Pulmonary Embolism: seeing hematologist, but difficulty going to [facility] to check INR, therefore is coming here. INR today and last visit was 2.6 . On coumadin since Nov 6th, 2019. She is tolerating it well, no easy bruising, hematuria of blood in stools. She has follow up with hematologist in March, advised to return in 1 month for INR   Patient Active Problem List   Diagnosis Date Noted  . Pulmonary embolism ([place]) 07/24/2018  . History of spinal fusion 05/21/2018  . Essential hypertension 01/22/2018  . Morbid obesity with body mass index of 50.0-59.9 in adult ([place]) 01/22/2018  . Pre-diabetes 01/22/2018  . Intramural leiomyoma of uterus 11/30/2017  . Iron deficiency anemia due to chronic blood loss 09/25/2017  . Low protein C activity level ([place]) 09/25/2017  . Multiple sclerosis ([place]) 04/15/2017  . Vitamin D deficiency 04/15/2017  . Low back pain radiating to both legs 07/28/2015  . History of colonic polyps 02/12/2014  . Personal history of other venous thrombosis and embolism 01/03/2013  . Bilateral leg weakness 11/20/2012  . Hyperlipidemia with target LDL less than 100 10/27/2011  . Presbyopia 05/10/2011  . Microalbuminuria 04/22/2011  . Obstructive sleep apnea 01/15/2011    Past Surgical History:  Procedure Laterality Date  . COLONOSCOPY  2017   Found 1 polyp-[place]    Family History  Problem Relation Age of Onset  . Heart attack Mother   . Ulcers Mother        Legs  . Hypertension Mother   . COPD Father        Tobacco user  . Diabetes Mellitus II Father        Later on in life  . Cataracts Father   . Stroke Sister   . Anxiety disorder Sister   . Drug abuse Brother   . Alcohol abuse Brother   . Hypertension Sister   . Anxiety disorder Sister      Social History   Socioeconomic History  . Marital status: Single    Spouse name: Not on file  . Number of children: 0  . Years of education: Not on file  . Highest education level: Associate degree: occupational, Hotel manager, or vocational program  Occupational History  . Occupation: Arboriculturist Needs  . Financial resource strain: Not hard at all  . Food insecurity:    Worry: Never true    Inability: Never true  . Transportation needs:    Medical: No    Non-medical: No  Tobacco Use  . Smoking status: Never Smoker  . Smokeless tobacco: Never Used  Substance and Sexual Activity  . Alcohol use: Yes    Comment: occasionally  . Drug use: Never  . Sexual activity: Not Currently    Partners: Male  Lifestyle  . Physical activity:    Days per week: 0 days    Minutes per session: 0 min  . Stress: Not at all  Relationships  . Social connections:    Talks on phone: More than three times a week    Gets together: More than three times a week    Attends religious service: Never    Active member of club or organization: No    Attends meetings of clubs or  organizations: Never    Relationship status: Never married  . Intimate partner violence:    Fear of current or ex partner: No    Emotionally abused: No    Physically abused: No    Forced sexual activity: No  Other Topics Concern  . Not on file  Social History Narrative   Lives in [place] with her sister     Current Outpatient Medications:  .  acetaminophen (TYLENOL) 500 MG tablet, Take 500 mg by mouth every 6 (six) hours as needed., Disp: , Rfl:  .  ferrous sulfate 325 (65 FE) MG tablet, Take 1 tablet (325 mg total) by mouth daily with breakfast., Disp: 30 tablet, Rfl: 0 .  furosemide (LASIX) 20 MG tablet, Take 10 mg by mouth as needed for edema., Disp: , Rfl:  .  losartan (COZAAR) 25 MG tablet, Take 1 tablet (25 mg total) by mouth daily., Disp: 90 tablet, Rfl: 1 .  warfarin (COUMADIN) 6 MG tablet, Take 1 tablet  by mouth daily., Disp: , Rfl:   Allergies  Allergen Reactions  . Gabapentin     Tic disorder  . Metformin And Related     Not sure  . Amoxicillin-Pot Clavulanate Nausea And Vomiting    I personally reviewed active problem list, medication list, allergies, family history, social history with the patient/caregiver today.   ROS   Ten systems reviewed and is negative except as mentioned in HPI   Objective  Vitals:   08/06/18 1020  BP: 114/80  Pulse: 82  Resp: 16  Temp: 97.9 F (36.6 C)  TempSrc: Oral  SpO2: 99%  Weight: (!) 321 lb 8 oz (145.8 kg)  Height: 5\' 7"  (1.702 m)    Body mass index is 50.35 kg/m.  Physical Exam  Constitutional: Patient appears well-developed and well-nourished. Obese  No distress.  HEENT: head atraumatic, normocephalic, pupils equal and reactive to light, neck supple, throat within normal limits Cardiovascular: Normal rate, regular rhythm and normal heart sounds.  No murmur heard. No BLE edema. Pulmonary/Chest: Effort normal and breath sounds normal. No respiratory distress. Abdominal: Soft.  There is no tenderness. Psychiatric: Patient has a normal mood and affect. behavior is normal. Judgment and thought content normal.  Recent Results (from the past 2160 hour(s))  CBC and differential     Status: Abnormal   Collection Time: 07/07/18 12:00 AM  Result Value Ref Range   Hemoglobin 8.4 (A) 12.0 - 16.0   HCT 27 (A) 36 - 46  Protime-INR     Status: Abnormal   Collection Time: 07/07/18 12:00 AM  Result Value Ref Range   Protime 21.9 (A) 10.0 - 13.8    Comment: INR-2.0  POCT INR     Status: Normal   Collection Time: 08/06/18 10:43 AM  Result Value Ref Range   INR 2.6 2.0 - 3.0      PHQ2/9: Depression screen [facility] 2/9 05/03/2018 01/22/2018  Decreased Interest 0 0  Down, Depressed, Hopeless 0 0  PHQ - 2 Score 0 0  Altered sleeping 0 0  Tired, decreased energy 0 1  Change in appetite 0 0  Feeling bad or failure about yourself  0 0   Trouble concentrating 0 0  Moving slowly or fidgety/restless 0 0  Suicidal thoughts 0 0  PHQ-9 Score 0 1  Difficult doing work/chores Not difficult at all Not difficult at all     Fall Risk: Fall Risk  08/06/2018 07/24/2018 05/03/2018 01/22/2018  Falls in the past year? 0 0  No Yes  Number falls in past yr: 0 - - 2 or more  Injury with Fall? 0 - - Yes  Comment - - - Back cut her self on a jagged edge     Assessment & Plan  1. Chronic saddle pulmonary embolism without acute cor pulmonale (HCC)  - POCT INR Continue 6 mg daily   Doing well , recheck in 1 month

## 2018-08-27 ENCOUNTER — Other Ambulatory Visit (HOSPITAL_COMMUNITY)
Admission: RE | Admit: 2018-08-27 | Discharge: 2018-08-27 | Disposition: A | Discharge status: Home / Self Care | Payer: BC Managed Care – PPO | Source: Ambulatory Visit | Attending: Family Medicine | Admitting: Family Medicine

## 2018-08-27 ENCOUNTER — Encounter: Payer: Self-pay | Admitting: Family Medicine

## 2018-08-27 ENCOUNTER — Ambulatory Visit (INDEPENDENT_AMBULATORY_CARE_PROVIDER_SITE_OTHER): Payer: BC Managed Care – PPO | Admitting: Family Medicine

## 2018-08-27 VITALS — BP 130/76 | HR 100 | Temp 98.0°F | Resp 16 | Ht 67.0 in | Wt 325.4 lb

## 2018-08-27 DIAGNOSIS — Z124 Encounter for screening for malignant neoplasm of cervix: Secondary | ICD-10-CM | POA: Insufficient documentation

## 2018-08-27 DIAGNOSIS — Z13820 Encounter for screening for osteoporosis: Secondary | ICD-10-CM

## 2018-08-27 DIAGNOSIS — D6859 Other primary thrombophilia: Secondary | ICD-10-CM

## 2018-08-27 DIAGNOSIS — Z01419 Encounter for gynecological examination (general) (routine) without abnormal findings: Secondary | ICD-10-CM | POA: Insufficient documentation

## 2018-08-27 DIAGNOSIS — I2692 Saddle embolus of pulmonary artery without acute cor pulmonale: Secondary | ICD-10-CM

## 2018-08-27 DIAGNOSIS — Z1239 Encounter for other screening for malignant neoplasm of breast: Secondary | ICD-10-CM

## 2018-08-27 LAB — POCT INR: INR: 2.6 (ref 2.0–3.0)

## 2018-08-27 NOTE — Addendum Note (Signed)
Addended by: Takayla Baillie, Ulla Potash on: 08/27/2018 02:40 PM   Modules accepted: Orders

## 2018-08-27 NOTE — Progress Notes (Addendum)
Name: Morgan Craig   MRN: [id number]    DOB: [birth year]   Date:08/27/2018       Progress Note  Subjective  Chief Complaint  Chief Complaint  Patient presents with  . Annual Exam    HPI  Patient presents for annual CPE.  INR Check: Had saddle PE in December 2019, on coumadin x47mo for management, she has hx low protein C activity level. Denies signs and symptoms of bleeding - no hematuria, hematochezia, gum bleeding, epistaxis, or abnormal vaginal bleeding. Current dose is 68mdaily. Last INR was 2.6, today she is 2.6 again.  She is managed by our clinic due to difficulty getting to coumadin clinic. Has follow up with hematologist in March 2020.  Diet: Balanced, trying to eat healthy meals Exercise: Not exercising currently.   USPSTF grade A and B recommendations    [facility]  [facility]     Depression:  [facility] 08/27/2018 05/03/2018 01/22/2018  Decreased Interest 0 0 0  Down, Depressed, Hopeless 0 0 0  PHQ - 2 Score 0 0 0  Altered sleeping 0 0 0  Tired, decreased energy 0 0 1  Change in appetite 0 0 0  Feeling bad or failure about yourself  0 0 0  Trouble concentrating 0 0 0  Moving slowly or fidgety/restless 0 0 0  Suicidal thoughts 0 0 0  PHQ-9 Score 0 0 1  Difficult doing work/chores Not difficult at all Not difficult at all Not difficult at all   Hypertension: BP Readings from Last 3 Encounters:  08/27/18 130/76  08/06/18 114/80  07/24/18 110/72   Obesity: Wt Readings from Last 3 Encounters:  08/27/18 (!) 325 lb 6.4 oz (147.6 kg)  08/06/18 (!) 321 lb 8 oz (145.8 kg)  07/24/18 (!) 325 lb 4.8 oz (147.6 kg)   BMI Readings from Last 3 Encounters:  08/27/18 50.96 kg/m  08/06/18 50.35 kg/m  07/24/18 50.95 kg/m    Hep C Screening: Negative in 2018 STD testing and prevention (HIV/chl/gon/syphilis): HIV negative in 2018; not sexually active in years - declines additional testing Intimate  partner violence: No concerns Sexual History/Pain during Intercourse: No concerns  Menstrual History/LMP/Abnormal Bleeding: LMP - 08/14/2018 - tends to be heavier due to history of fibroids x2 days, then decreases in intensity. On coumadin for recent PE and this does not seem to have affected the intensity.  Discussed GYN referral for fibroid eval and she declines. Incontinence Symptoms: No concerns  Advanced Care Planning: A voluntary discussion about advance care planning including the explanation and discussion of advance directives.  Discussed health care proxy and Living will, and the patient was able to identify a health care proxy as Sister ([facility]).  Patient does not have a living will at present time. If patient does have living will, I have requested they bring this to the clinic to be scanned in to their chart.  Breast cancer:  HM Mammogram  Date Value Ref Range Status  02/28/2018 0-4 Bi-Rad 0-4 Bi-Rad, Self Reported Normal Final    BRCA gene screening: No family history Cervical cancer screening: We will do today  Osteoporosis Screening: No family history. No results found for: HMDEXASCAN  Lipids:  Lab Results  Component Value Date   CHOL 182 01/22/2018   Lab Results  Component Value Date   HDL 44 (L) 01/22/2018   Lab Results  Component Value Date   LDLCALC 117 (H) 01/22/2018  Lab Results  Component Value Date   TRIG 100 01/22/2018   Lab Results  Component Value Date   CHOLHDL 4.1 01/22/2018   No results found for: LDLDIRECT  Glucose:  Glucose, Bld  Date Value Ref Range Status  01/22/2018 93 65 - 99 mg/dL Final    Comment:    .            Fasting reference interval .     Skin cancer: No concerning moles or lesions. Colorectal cancer: Had colonoscopy in 2016 - repeat in 2026 per records with [facility].  Denies family or personal history of colorectal cancer, no changes in BM's - no blood in stool, dark and tarry stool, mucus in stool, or  constipation/diarrhea. Lung cancer:  Non-smoker; Low Dose CT Chest recommended if Age 35-80 years, 30 pack-year currently smoking OR have quit w/in 15years. Patient does not qualify.   ECG: None on file; denies chest pain or shortness of breath.   Patient Active Problem List   Diagnosis Date Noted  . Pulmonary embolism ([place]) 07/24/2018  . History of spinal fusion 05/21/2018  . Essential hypertension 01/22/2018  . Morbid obesity with body mass index of 50.0-59.9 in adult ([place]) 01/22/2018  . Pre-diabetes 01/22/2018  . Intramural leiomyoma of uterus 11/30/2017  . Iron deficiency anemia due to chronic blood loss 09/25/2017  . Low protein C activity level ([place]) 09/25/2017  . Multiple sclerosis ([place]) 04/15/2017  . Vitamin D deficiency 04/15/2017  . Low back pain radiating to both legs 07/28/2015  . History of colonic polyps 02/12/2014  . Personal history of other venous thrombosis and embolism 01/03/2013  . Bilateral leg weakness 11/20/2012  . Hyperlipidemia with target LDL less than 100 10/27/2011  . Presbyopia 05/10/2011  . Microalbuminuria 04/22/2011  . Obstructive sleep apnea 01/15/2011    Past Surgical History:  Procedure Laterality Date  . COLONOSCOPY  2017   Found 1 polyp-[place]    Family History  Problem Relation Age of Onset  . Heart attack Mother   . Ulcers Mother        Legs  . Hypertension Mother   . COPD Father        Tobacco user  . Diabetes Mellitus II Father        Later on in life  . Cataracts Father   . Stroke Sister   . Anxiety disorder Sister   . Drug abuse Brother   . Alcohol abuse Brother   . Hypertension Sister   . Anxiety disorder Sister     Social History   Socioeconomic History  . Marital status: Single    Spouse name: Not on file  . Number of children: 0  . Years of education: Not on file  . Highest education level: Associate degree: occupational, Hotel manager, or vocational program  Occupational History  . Occupation: Aeronautical engineer Needs  . Financial resource strain: Not hard at all  . Food insecurity:    Worry: Never true    Inability: Never true  . Transportation needs:    Medical: No    Non-medical: No  Tobacco Use  . Smoking status: Never Smoker  . Smokeless tobacco: Never Used  Substance and Sexual Activity  . Alcohol use: Yes    Comment: occasionally  . Drug use: Never  . Sexual activity: Not Currently    Partners: Male  Lifestyle  . Physical activity:    Days per week: 0 days    Minutes per session: 0 min  .  Stress: Not at all  Relationships  . Social connections:    Talks on phone: More than three times a week    Gets together: More than three times a week    Attends religious service: Never    Active member of club or organization: No    Attends meetings of clubs or organizations: Never    Relationship status: Never married  . Intimate partner violence:    Fear of current or ex partner: No    Emotionally abused: No    Physically abused: No    Forced sexual activity: No  Other Topics Concern  . Not on file  Social History Narrative   Lives in [place] with her sister     Current Outpatient Medications:  .  acetaminophen (TYLENOL) 500 MG tablet, Take 500 mg by mouth every 6 (six) hours as needed., Disp: , Rfl:  .  ferrous sulfate 325 (65 FE) MG tablet, Take 1 tablet (325 mg total) by mouth daily with breakfast., Disp: 30 tablet, Rfl: 0 .  furosemide (LASIX) 20 MG tablet, Take 10 mg by mouth as needed for edema., Disp: , Rfl:  .  losartan (COZAAR) 25 MG tablet, Take 1 tablet (25 mg total) by mouth daily., Disp: 90 tablet, Rfl: 1 .  warfarin (COUMADIN) 6 MG tablet, Take 1 tablet by mouth daily., Disp: , Rfl:   Allergies  Allergen Reactions  . Gabapentin     Tic disorder  . Metformin And Related     Not sure  . Amoxicillin-Pot Clavulanate Nausea And Vomiting   ROS  Constitutional: Negative for fever or weight change.  Respiratory: Negative for cough and shortness of breath.    Cardiovascular: Negative for chest pain or palpitations.  Gastrointestinal: Negative for abdominal pain, no bowel changes.  Musculoskeletal: Negative for gait problem or joint swelling.  Skin: Negative for rash.  Neurological: Negative for dizziness or headache.  No other specific complaints in a complete review of systems (except as listed in HPI above).  Objective  Vitals:   08/27/18 1350  BP: 130/76  Pulse: 100  Resp: 16  Temp: 98 F (36.7 C)  TempSrc: Oral  SpO2: 98%  Weight: (!) 325 lb 6.4 oz (147.6 kg)  Height: '5\' 7"'  (1.702 m)    Body mass index is 50.96 kg/m.  Physical Exam  Constitutional: Patient appears well-developed and well-nourished. No distress.  HENT: Head: Normocephalic and atraumatic. Ears: B TMs ok, no erythema or effusion; Nose: Nose normal. Mouth/Throat: Oropharynx is clear and moist. No oropharyngeal exudate.  Eyes: Conjunctivae and EOM are normal. Pupils are equal, round, and reactive to light. No scleral icterus.  Neck: Normal range of motion. Neck supple. No JVD present. No thyromegaly present.  Cardiovascular: Normal rate, regular rhythm and normal heart sounds.  No murmur heard. No BLE edema. Pulmonary/Chest: Effort normal and breath sounds normal. No respiratory distress. Abdominal: Soft. Bowel sounds are normal, no distension. There is no tenderness. no masses Breast: no lumps or masses, no nipple discharge or rashes FEMALE GENITALIA:  External genitalia normal External urethra normal Vaginal vault normal without discharge or lesions Cervix normal without discharge or lesions Bimanual exam normal without masses Musculoskeletal: Normal range of motion, no joint effusions. No gross deformities Neurological: he is alert and oriented to person, place, and time. No cranial nerve deficit. Coordination, balance, strength, speech and gait are normal.  Skin: Skin is warm and dry. No rash noted. No erythema.  Psychiatric: Patient has a normal mood and  affect. behavior is normal. Judgment and thought content normal.  Recent Results (from the past 2160 hour(s))  CBC and differential     Status: Abnormal   Collection Time: 07/07/18 12:00 AM  Result Value Ref Range   Hemoglobin 8.4 (A) 12.0 - 16.0   HCT 27 (A) 36 - 46  Protime-INR     Status: Abnormal   Collection Time: 07/07/18 12:00 AM  Result Value Ref Range   Protime 21.9 (A) 10.0 - 13.8    Comment: INR-2.0  POCT INR     Status: Normal   Collection Time: 08/06/18 10:43 AM  Result Value Ref Range   INR 2.6 2.0 - 3.0  POCT INR     Status: Abnormal   Collection Time: 08/27/18  2:30 PM  Result Value Ref Range   INR 2.6 2.0 - 3.0   PHQ2/9: Depression screen [facility] 2/9 08/27/2018 05/03/2018 01/22/2018  Decreased Interest 0 0 0  Down, Depressed, Hopeless 0 0 0  PHQ - 2 Score 0 0 0  Altered sleeping 0 0 0  Tired, decreased energy 0 0 1  Change in appetite 0 0 0  Feeling bad or failure about yourself  0 0 0  Trouble concentrating 0 0 0  Moving slowly or fidgety/restless 0 0 0  Suicidal thoughts 0 0 0  PHQ-9 Score 0 0 1  Difficult doing work/chores Not difficult at all Not difficult at all Not difficult at all   Fall Risk: Fall Risk  08/27/2018 08/06/2018 07/24/2018 05/03/2018 01/22/2018  Falls in the past year? 0 0 0 No Yes  Number falls in past yr: 0 0 - - 2 or more  Injury with Fall? 0 0 - - Yes  Comment - - - - Back cut her self on a jagged edge  Follow up Falls evaluation completed - - - -   Assessment & Plan  1. Well woman exam -USPSTF grade A and B recommendations reviewed with patient; age-appropriate recommendations, preventive care, screening tests, etc discussed and encouraged; healthy living encouraged; see AVS for patient education given to patient -Discussed importance of 150 minutes of physical activity weekly, eat two servings of fish weekly, eat one serving of tree nuts ( cashews, pistachios, pecans, almonds.Marland Kitchen) every other day, eat 6 servings of fruit/vegetables  daily and drink plenty of water and avoid sweet beverages.  - Cytology - PAP - DG Bone Density; Future - MM 3D SCREEN BREAST BILATERAL; Future  2. Cervical cancer screening - Cytology - PAP  3. Osteoporosis screening - DG Bone Density; Future - Will do in July with mammogram  4. Breast cancer screening - MM 3D SCREEN BREAST BILATERAL; Future - Will do in July when due.  5. 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 Morbid obesity ([place]) - See above regarding teaching  7. Low protein C activity level (HCC) - POCT INR

## 2018-08-27 NOTE — Patient Instructions (Signed)
Preventive Care 40-64 Years, Female Preventive care refers to lifestyle choices and visits with your health care provider that can promote health and wellness. What does preventive care include?   A yearly physical exam. This is also called an annual well check.  Dental exams once or twice a year.  Routine eye exams. Ask your health care provider how often you should have your eyes checked.  Personal lifestyle choices, including: ? Daily care of your teeth and gums. ? Regular physical activity. ? Eating a healthy diet. ? Avoiding tobacco and drug use. ? Limiting alcohol use. ? Practicing safe sex. ? Taking low-dose aspirin daily starting at age 50. ? Taking vitamin and mineral supplements as recommended by your health care provider. What happens during an annual well check? The services and screenings done by your health care provider during your annual well check will depend on your age, overall health, lifestyle risk factors, and family history of disease. Counseling Your health care provider may ask you questions about your:  Alcohol use.  Tobacco use.  Drug use.  Emotional well-being.  Home and relationship well-being.  Sexual activity.  Eating habits.  Work and work environment.  Method of birth control.  Menstrual cycle.  Pregnancy history. Screening You may have the following tests or measurements:  Height, weight, and BMI.  Blood pressure.  Lipid and cholesterol levels. These may be checked every 5 years, or more frequently if you are over 50 years old.  Skin check.  Lung cancer screening. You may have this screening every year starting at age 55 if you have a 30-pack-year history of smoking and currently smoke or have quit within the past 15 years.  Colorectal cancer screening. All adults should have this screening starting at age 50 and continuing until age 75. Your health care provider may recommend screening at age 45. You will have tests every  1-10 years, depending on your results and the type of screening test. People at increased risk should start screening at an earlier age. Screening tests may include: ? Guaiac-based fecal occult blood testing. ? Fecal immunochemical test (FIT). ? Stool DNA test. ? Virtual colonoscopy. ? Sigmoidoscopy. During this test, a flexible tube with a tiny camera (sigmoidoscope) is used to examine your rectum and lower colon. The sigmoidoscope is inserted through your anus into your rectum and lower colon. ? Colonoscopy. During this test, a long, thin, flexible tube with a tiny camera (colonoscope) is used to examine your entire colon and rectum.  Hepatitis C blood test.  Hepatitis B blood test.  Sexually transmitted disease (STD) testing.  Diabetes screening. This is done by checking your blood sugar (glucose) after you have not eaten for a while (fasting). You may have this done every 1-3 years.  Mammogram. This may be done every 1-2 years. Talk to your health care provider about when you should start having regular mammograms. This may depend on whether you have a family history of breast cancer.  BRCA-related cancer screening. This may be done if you have a family history of breast, ovarian, tubal, or peritoneal cancers.  Pelvic exam and Pap test. This may be done every 3 years starting at age 21. Starting at age 30, this may be done every 5 years if you have a Pap test in combination with an HPV test.  Bone density scan. This is done to screen for osteoporosis. You may have this scan if you are at high risk for osteoporosis. Discuss your test results, treatment options,   age 30, this may be done every 5 years if you have a Pap test in combination with an HPV test.   Bone density scan. This is done to screen for osteoporosis. You may have this scan if you are at high risk for osteoporosis.  Discuss your test results, treatment options, and if necessary, the need for more tests with your health care provider.  Vaccines  Your health care provider may recommend certain vaccines, such as:   Influenza vaccine. This is recommended every year.   Tetanus, diphtheria, and acellular pertussis (Tdap, Td) vaccine. You may need a Td booster every 10 years.   Varicella  vaccine. You may need this if you have not been vaccinated.   Zoster vaccine. You may need this after age 60.   Measles, mumps, and rubella (MMR) vaccine. You may need at least one dose of MMR if you were born in 1957 or later. You may also need a second dose.   Pneumococcal 13-valent conjugate (PCV13) vaccine. You may need this if you have certain conditions and were not previously vaccinated.   Pneumococcal polysaccharide (PPSV23) vaccine. You may need one or two doses if you smoke cigarettes or if you have certain conditions.   Meningococcal vaccine. You may need this if you have certain conditions.   Hepatitis A vaccine. You may need this if you have certain conditions or if you travel or work in places where you may be exposed to hepatitis A.   Hepatitis B vaccine. You may need this if you have certain conditions or if you travel or work in places where you may be exposed to hepatitis B.   Haemophilus influenzae type b (Hib) vaccine. You may need this if you have certain conditions.  Talk to your health care provider about which screenings and vaccines you need and how often you need them.  This information is not intended to replace advice given to you by your health care provider. Make sure you discuss any questions you have with your health care provider.  Document Released: 08/28/2015 Document Revised: 09/21/2017 Document Reviewed: 06/02/2015  Elsevier Interactive Patient Education  2019 [employer]vier Inc[employer]

## 2018-08-27 NOTE — Addendum Note (Signed)
Addended by: Hubbard Hartshorn on: 08/27/2018 02:36 PM   Modules accepted: Orders

## 2018-08-28 LAB — CYTOLOGY - PAP
Adequacy: ABSENT
Diagnosis: NEGATIVE
HPV: NOT DETECTED

## 2018-09-06 ENCOUNTER — Other Ambulatory Visit: Payer: Self-pay | Admitting: Family Medicine

## 2018-09-06 ENCOUNTER — Ambulatory Visit: Payer: BC Managed Care – PPO | Admitting: Family Medicine

## 2018-09-06 MED ORDER — WARFARIN SODIUM 6 MG PO TABS: 6.0000 mg | ORAL_TABLET | Freq: Every day | ORAL | 0 refills | Status: DC

## 2018-09-06 NOTE — Telephone Encounter (Signed)
Copied from [place] [id number]. Topic: Quick Communication - Rx Refill/Question >> Sep 06, 2018  4:26 PM Mcneil, Ja-Kwan wrote: Medication: losartan (COZAAR) 25 MG tablet and warfarin (COUMADIN) 6 MG tablet  Has the patient contacted their pharmacy? yes   Preferred Pharmacy (with phone number or street name): [facility] - [place], [place] - [address] [phone] (Phone)  [phone] (Fax)  Agent: Please be advised that RX refills may take up to 3 business days. We ask that you follow-up with your pharmacy.

## 2018-09-07 ENCOUNTER — Other Ambulatory Visit: Payer: Self-pay | Admitting: Family Medicine

## 2018-09-12 ENCOUNTER — Other Ambulatory Visit: Payer: Self-pay | Admitting: Family Medicine

## 2018-09-12 DIAGNOSIS — I10 Essential (primary) hypertension: Secondary | ICD-10-CM

## 2018-09-12 MED ORDER — LOSARTAN POTASSIUM 25 MG PO TABS: 25.0000 mg | ORAL_TABLET | Freq: Every day | ORAL | 0 refills | Status: DC

## 2018-09-12 NOTE — Telephone Encounter (Signed)
Copied from [place] [phone]. Topic: Quick Communication - Rx Refill/Question >> Sep 12, 2018 10:18 AM Bea Graff, NT wrote: Medication: losartan (COZAAR) 25 MG tablet   Has the patient contacted their pharmacy? Yes.   (Agent: If no, request that the patient contact the pharmacy for the refill.) (Agent: If yes, when and what did the pharmacy advise?)  Preferred Pharmacy (with phone number or street name): [facility] - [place], [place] - [address] [phone] (Phone) [phone] (Fax)    Agent: Please be advised that RX refills may take up to 3 business days. We ask that you follow-up with your pharmacy.

## 2018-10-01 ENCOUNTER — Encounter: Payer: Self-pay | Admitting: Family Medicine

## 2018-10-01 ENCOUNTER — Ambulatory Visit: Payer: BC Managed Care – PPO | Admitting: Family Medicine

## 2018-10-01 VITALS — BP 120/70 | HR 105 | Temp 97.8°F | Resp 16 | Ht 67.0 in | Wt 331.3 lb

## 2018-10-01 DIAGNOSIS — Z7901 Long term (current) use of anticoagulants: Secondary | ICD-10-CM | POA: Diagnosis not present

## 2018-10-01 DIAGNOSIS — I2692 Saddle embolus of pulmonary artery without acute cor pulmonale: Secondary | ICD-10-CM

## 2018-10-01 DIAGNOSIS — G35 Multiple sclerosis: Secondary | ICD-10-CM | POA: Diagnosis not present

## 2018-10-01 DIAGNOSIS — Z5181 Encounter for therapeutic drug level monitoring: Secondary | ICD-10-CM | POA: Diagnosis not present

## 2018-10-01 DIAGNOSIS — I10 Essential (primary) hypertension: Secondary | ICD-10-CM

## 2018-10-01 DIAGNOSIS — D6859 Other primary thrombophilia: Secondary | ICD-10-CM

## 2018-10-01 LAB — POCT INR: INR: 1.9 — AB (ref 2.0–3.0)

## 2018-10-01 MED ORDER — LOSARTAN POTASSIUM 25 MG PO TABS: 25.0000 mg | ORAL_TABLET | Freq: Every day | ORAL | 1 refills | Status: DC

## 2018-10-01 MED ORDER — WARFARIN SODIUM 6 MG PO TABS: 6.0000 mg | ORAL_TABLET | Freq: Every day | ORAL | 1 refills | Status: DC

## 2018-10-01 NOTE — Progress Notes (Signed)
Name: Morgan Craig   MRN: [id number]    DOB: 29-Nov-1960   Date:10/01/2018       Progress Note  Subjective  Chief Complaint  Chief Complaint  Patient presents with  . Anticoagulation  . Hyperlipidemia  . Hypertension    HPI  INR: had neck fusion  on 05/21/2018, she started to have headache and one episode of SOB about one month after surgery.  Seen by hematologist and is going to be on coumadin for 6 months. She is no longer seeing hematologist. She states pulmonologist is going to manage her from now on. Repeat Echo and scan. Her last PFT was done at [facility] on 09/04/2018 and it was normal. INR has been therapeutic until today. Usually at goal. She states eating more greens now. Discussed returning in one week for recheck INR but she states she has a follow up in about 2 weeks with pulmonologist and will ask to get it checked there. She could not use Xarelto because of her weight. She denies sob or wheezing.   HTN: bp is at goal today, denies chest pain or palpitation   Morbid obesity: she gained some weight since last visit, she states it is because she is wearing boots. She is trying to pack her lunch at work  MS: still getting Ocrevus injection, seeing neurologist at [facility], continues to have some tingling on both legs, but decided not to take oral medication because she was worried about small sample size of study subjects. Advised to contact them back. She continues to have weakness on quads, uses a cane to assist with gait.    Patient Active Problem List   Diagnosis Date Noted  . Pulmonary embolism ([place]) 07/24/2018  . History of spinal fusion 05/21/2018  . Essential hypertension 01/22/2018  . Morbid obesity with body mass index of 50.0-59.9 in adult ([place]) 01/22/2018  . Pre-diabetes 01/22/2018  . Intramural leiomyoma of uterus 11/30/2017  . Iron deficiency anemia due to chronic blood loss 09/25/2017  . Low protein C activity level ([place]) 09/25/2017  . Multiple sclerosis ([place]) 04/15/2017   . Vitamin D deficiency 04/15/2017  . Low back pain radiating to both legs 07/28/2015  . History of colonic polyps 02/12/2014  . Personal history of other venous thrombosis and embolism 01/03/2013  . Bilateral leg weakness 11/20/2012  . Hyperlipidemia with target LDL less than 100 10/27/2011  . Presbyopia 05/10/2011  . Microalbuminuria 04/22/2011  . Obstructive sleep apnea 01/15/2011    Past Surgical History:  Procedure Laterality Date  . COLONOSCOPY  2017   Found 1 polyp-[place]    Family History  Problem Relation Age of Onset  . Heart attack Mother   . Ulcers Mother        Legs  . Hypertension Mother   . COPD Father        Tobacco user  . Diabetes Mellitus II Father        Later on in life  . Cataracts Father   . Stroke Sister   . Anxiety disorder Sister   . Drug abuse Brother   . Alcohol abuse Brother   . Hypertension Sister   . Anxiety disorder Sister     Social History   Socioeconomic History  . Marital status: Single    Spouse name: Not on file  . Number of children: 0  . Years of education: Not on file  . Highest education level: Associate degree: occupational, Hotel manager, or vocational program  Occupational History  . Occupation: Dentist  Social Needs  . Financial resource strain: Not hard at all  . Food insecurity:    Worry: Never true    Inability: Never true  . Transportation needs:    Medical: No    Non-medical: No  Tobacco Use  . Smoking status: Never Smoker  . Smokeless tobacco: Never Used  Substance and Sexual Activity  . Alcohol use: Yes    Comment: occasionally  . Drug use: Never  . Sexual activity: Not Currently    Partners: Male  Lifestyle  . Physical activity:    Days per week: 0 days    Minutes per session: 0 min  . Stress: Not at all  Relationships  . Social connections:    Talks on phone: More than three times a week    Gets together: More than three times a week    Attends religious service: Never    Active  member of club or organization: No    Attends meetings of clubs or organizations: Never    Relationship status: Never married  . Intimate partner violence:    Fear of current or ex partner: No    Emotionally abused: No    Physically abused: No    Forced sexual activity: No  Other Topics Concern  . Not on file  Social History Narrative   Lives in [place] with her sister     Current Outpatient Medications:  .  acetaminophen (TYLENOL) 500 MG tablet, Take 500 mg by mouth every 6 (six) hours as needed., Disp: , Rfl:  .  ferrous sulfate 325 (65 FE) MG tablet, Take 1 tablet (325 mg total) by mouth daily with breakfast., Disp: 30 tablet, Rfl: 0 .  furosemide (LASIX) 20 MG tablet, Take 10 mg by mouth as needed for edema., Disp: , Rfl:  .  losartan (COZAAR) 25 MG tablet, Take 1 tablet (25 mg total) by mouth daily., Disp: 30 tablet, Rfl: 0 .  warfarin (COUMADIN) 6 MG tablet, Take 1 tablet (6 mg total) by mouth daily., Disp: 30 tablet, Rfl: 0  Allergies  Allergen Reactions  . Gabapentin     Tic disorder  . Metformin And Related     Not sure  . Amoxicillin-Pot Clavulanate Nausea And Vomiting    I personally reviewed active problem list, medication list, allergies, family history, social history with the patient/caregiver today.   ROS  Constitutional: Negative for fever, positive for mild  weight change.  Respiratory: Negative for cough and shortness of breath.   Cardiovascular: Negative for chest pain or palpitations.  Gastrointestinal: Negative for abdominal pain, no bowel changes.  Musculoskeletal: Positive for gait problem but no  joint swelling.  Skin: Negative for rash.  Neurological: Negative for dizziness or headache.  No other specific complaints in a complete review of systems (except as listed in HPI above).  Objective  Vitals:   10/01/18 1427  BP: 120/70  Pulse: (!) 105  Resp: 16  Temp: 97.8 F (36.6 C)  TempSrc: Oral  SpO2: 98%  Weight: (!) 331 lb 4.8 oz (150.3  kg)  Height: 5\' 7"  (1.702 m)    Body mass index is 51.89 kg/m.  Physical Exam  Constitutional: Patient appears well-developed and well-nourished. Obese  No distress.  HEENT: head atraumatic, normocephalic, pupils equal and reactive to light,  neck supple, throat within normal limits Cardiovascular: Normal rate, regular rhythm and normal heart sounds.  No murmur heard. No BLE edema. Pulmonary/Chest: Effort normal and breath sounds normal. No respiratory distress. Abdominal: Soft.  There is no tenderness. Muscular Skeletal: uses a cane to prevent falls. She has some weakness of left quad and tightness of hamstring, better than it was before surgery, discussed PT, she will try home exercise  Psychiatric: Patient has a normal mood and affect. behavior is normal. Judgment and thought content normal.  Recent Results (from the past 2160 hour(s))  CBC and differential     Status: Abnormal   Collection Time: 07/07/18 12:00 AM  Result Value Ref Range   Hemoglobin 8.4 (A) 12.0 - 16.0   HCT 27 (A) 36 - 46  Protime-INR     Status: Abnormal   Collection Time: 07/07/18 12:00 AM  Result Value Ref Range   Protime 21.9 (A) 10.0 - 13.8    Comment: INR-2.0  POCT INR     Status: Normal   Collection Time: 08/06/18 10:43 AM  Result Value Ref Range   INR 2.6 2.0 - 3.0  Cytology - PAP     Status: None   Collection Time: 08/27/18 12:00 AM  Result Value Ref Range   Adequacy      Satisfactory for evaluation  endocervical/transformation zone component ABSENT.   Diagnosis      NEGATIVE FOR INTRAEPITHELIAL LESIONS OR MALIGNANCY.   HPV NOT DETECTED     Comment: Normal Reference Range - NOT Detected   Material Submitted CervicoVaginal Pap [ThinPrep Imaged]   POCT INR     Status: Abnormal   Collection Time: 08/27/18  2:30 PM  Result Value Ref Range   INR 2.6 2.0 - 3.0  POCT INR     Status: Abnormal   Collection Time: 10/01/18  2:29 PM  Result Value Ref Range   INR 1.9 (A) 2.0 - 3.0       PHQ2/9: Depression screen [facility] 2/9 08/27/2018 05/03/2018 01/22/2018  Decreased Interest 0 0 0  Down, Depressed, Hopeless 0 0 0  PHQ - 2 Score 0 0 0  Altered sleeping 0 0 0  Tired, decreased energy 0 0 1  Change in appetite 0 0 0  Feeling bad or failure about yourself  0 0 0  Trouble concentrating 0 0 0  Moving slowly or fidgety/restless 0 0 0  Suicidal thoughts 0 0 0  PHQ-9 Score 0 0 1  Difficult doing work/chores Not difficult at all Not difficult at all Not difficult at all    Fall Risk: Fall Risk  10/01/2018 08/27/2018 08/06/2018 07/24/2018 05/03/2018  Falls in the past year? 0 0 0 0 No  Number falls in past yr: 0 0 0 - -  Injury with Fall? 0 0 0 - -  Comment - - - - -  Follow up - Falls evaluation completed - - -    Assessment & Plan  1. Encounter for monitoring Coumadin therapy  - POCT INR  2. Essential hypertension  - losartan (COZAAR) 25 MG tablet; Take 1 tablet (25 mg total) by mouth daily.  Dispense: 90 tablet; Refill: 1  3. Multiple sclerosis ([place])  Still going to [facility], sees Dr. Cyndia Skeeters   4. 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 Morbid obesity ([place])  Discussed with the patient the risk posed by an increased BMI. Discussed importance of portion control, calorie counting and at least 150 minutes of physical activity weekly. Avoid sweet beverages and drink more water. Eat at least 6 servings of fruit and vegetables daily   6. Low  protein C activity level ([place])  Evaluated by hematologist

## 2018-10-08 ENCOUNTER — Ambulatory Visit: Payer: BC Managed Care – PPO | Admitting: Family Medicine

## 2018-10-24 ENCOUNTER — Encounter: Payer: Self-pay | Admitting: Family Medicine

## 2019-01-08 ENCOUNTER — Other Ambulatory Visit: Payer: Self-pay | Admitting: Family Medicine

## 2019-01-08 NOTE — Telephone Encounter (Signed)
Copied from [place] [id number]. Topic: Quick Communication - Rx Refill/Question >> Jan 08, 2019  3:52 PM Margot Ables wrote: Medication: warfarin (COUMADIN) 6 MG tablet - pt takes 1/day - pt has 2 left (one tonight and one tomorrow night)  Has the patient contacted their pharmacy? Yes - was told no refills and to call MD [facility] told pt to contact Dr. Ancil Boozer Preferred Pharmacy (with phone number or street name): [facility] - [place], [place] - [address] [phone] (Phone) [phone] (Fax)

## 2019-01-11 ENCOUNTER — Encounter: Payer: Self-pay | Admitting: Family Medicine

## 2019-01-11 ENCOUNTER — Ambulatory Visit (INDEPENDENT_AMBULATORY_CARE_PROVIDER_SITE_OTHER): Payer: BC Managed Care – PPO | Admitting: Family Medicine

## 2019-01-11 VITALS — BP 129/75 | Temp 97.7°F

## 2019-01-11 DIAGNOSIS — G35 Multiple sclerosis: Secondary | ICD-10-CM

## 2019-01-11 DIAGNOSIS — I2782 Chronic pulmonary embolism: Secondary | ICD-10-CM

## 2019-01-11 DIAGNOSIS — D6859 Other primary thrombophilia: Secondary | ICD-10-CM

## 2019-01-11 DIAGNOSIS — I10 Essential (primary) hypertension: Secondary | ICD-10-CM | POA: Diagnosis not present

## 2019-01-11 DIAGNOSIS — I2692 Saddle embolus of pulmonary artery without acute cor pulmonale: Secondary | ICD-10-CM

## 2019-01-11 LAB — POCT INR: INR: 1.7 — AB (ref 2.0–3.0)

## 2019-01-11 NOTE — Progress Notes (Signed)
Name: Morgan Craig   MRN: [id number]    DOB: 03-14-1961   Date:01/11/2019       Progress Note  Subjective  Chief Complaint  Chief Complaint  Patient presents with  . Medication Refill    I connected with  Morgan Craig  on 01/11/19 at  3:20 PM EDT by a video enabled telemedicine application and verified that I am speaking with the correct person using two identifiers.  I discussed the limitations of evaluation and management by telemedicine and the availability of in person appointments. The patient expressed understanding and agreed to proceed. Staff also discussed with the patient that there may be a patient responsible charge related to this service. Patient Location: at the parking lot Provider Location: [facility]   HPI  INR: had neck fusion  on 05/21/2018, she started to have headache and one episode of SOB about one month after surgery.  Seen by hematologist and was told to be on coumadin for  6 months, however is now under the care of pulmonologist Dr. Fredrich Birks that has been managing her INR, she placed an order 12/2018 however patient did not know about it. She is coming in today and we will check it for her. Dr. Fredrich Birks wants her to stay on coumadin for the rest of her life. She has difficulty taking time off work. Explained that she either has to be seen here and get INR and coumadin from me on a regular basis or do it from pulmonologist office, cannot do both. Reminded her of risk of complications with coumadin. She could not use Xarelto because of her weight. She denies sob or wheezing. No bleeding or easy bruising. INR today is sub-therapeutic 1.7, but she skipped a dose this week, she will take once daily and return next week for a recheck   HTN: bp is at goal today, denies chest pain, dizziness  or palpitation   Morbid obesity: she is going to weight watchers now, started end of April.   MS: still getting Ocrevus injection,  at [facility] every 6 months, continues  to have some tingling on both legs, . She continues to have weakness on quads, uses a cane to assist with gait. Advised her to follow up with neurologist , only going to infusion center   Patient Active Problem List   Diagnosis Date Noted  . Pulmonary embolism ([place]) 07/24/2018  . History of spinal fusion 05/21/2018  . Essential hypertension 01/22/2018  . Morbid obesity with body mass index of 50.0-59.9 in adult ([place]) 01/22/2018  . Pre-diabetes 01/22/2018  . Intramural leiomyoma of uterus 11/30/2017  . Iron deficiency anemia due to chronic blood loss 09/25/2017  . Low protein C activity level ([place]) 09/25/2017  . Multiple sclerosis ([place]) 04/15/2017  . Vitamin D deficiency 04/15/2017  . Low back pain radiating to both legs 07/28/2015  . History of colonic polyps 02/12/2014  . Personal history of other venous thrombosis and embolism 01/03/2013  . Bilateral leg weakness 11/20/2012  . Hyperlipidemia with target LDL less than 100 10/27/2011  . Presbyopia 05/10/2011  . Microalbuminuria 04/22/2011  . Obstructive sleep apnea 01/15/2011    Past Surgical History:  Procedure Laterality Date  . COLONOSCOPY  2017   Found 1 polyp-[place]    Family History  Problem Relation Age of Onset  . Heart attack Mother   . Ulcers Mother        Legs  . Hypertension Mother   . COPD Father  Tobacco user  . Diabetes Mellitus II Father        Later on in life  . Cataracts Father   . Stroke Sister   . Anxiety disorder Sister   . Drug abuse Brother   . Alcohol abuse Brother   . Hypertension Sister   . Anxiety disorder Sister     Social History   Socioeconomic History  . Marital status: Single    Spouse name: Not on file  . Number of children: 0  . Years of education: Not on file  . Highest education level: Associate degree: occupational, Hotel manager, or vocational program  Occupational History  . Occupation: Arboriculturist Needs  . Financial resource strain: Not hard at all   . Food insecurity:    Worry: Never true    Inability: Never true  . Transportation needs:    Medical: No    Non-medical: No  Tobacco Use  . Smoking status: Never Smoker  . Smokeless tobacco: Never Used  Substance and Sexual Activity  . Alcohol use: Yes    Comment: occasionally  . Drug use: Never  . Sexual activity: Not Currently    Partners: Male  Lifestyle  . Physical activity:    Days per week: 0 days    Minutes per session: 0 min  . Stress: Not at all  Relationships  . Social connections:    Talks on phone: More than three times a week    Gets together: More than three times a week    Attends religious service: Never    Active member of club or organization: No    Attends meetings of clubs or organizations: Never    Relationship status: Never married  . Intimate partner violence:    Fear of current or ex partner: No    Emotionally abused: No    Physically abused: No    Forced sexual activity: No  Other Topics Concern  . Not on file  Social History Narrative   Lives in [place] with her sister     Current Outpatient Medications:  .  acetaminophen (TYLENOL) 500 MG tablet, Take 500 mg by mouth every 6 (six) hours as needed., Disp: , Rfl:  .  ferrous sulfate 325 (65 FE) MG tablet, Take 1 tablet (325 mg total) by mouth daily with breakfast., Disp: 30 tablet, Rfl: 0 .  losartan (COZAAR) 25 MG tablet, Take 1 tablet (25 mg total) by mouth daily., Disp: 90 tablet, Rfl: 1 .  warfarin (COUMADIN) 6 MG tablet, Take 1 tablet (6 mg total) by mouth daily., Disp: 90 tablet, Rfl: 1 .  furosemide (LASIX) 20 MG tablet, Take 10 mg by mouth as needed for edema., Disp: , Rfl:   Allergies  Allergen Reactions  . Gabapentin     Tic disorder  . Metformin And Related     Not sure  . Amoxicillin-Pot Clavulanate Nausea And Vomiting    I personally reviewed active problem list, medication list, allergies, family history with the patient/caregiver today.   ROS  Constitutional: Negative  for fever or weight change.  Respiratory: Negative for cough and shortness of breath.   Cardiovascular: Negative for chest pain or palpitations.  Gastrointestinal: Negative for abdominal pain, no bowel changes.  Musculoskeletal: Negative for gait problem or joint swelling.  Skin: Negative for rash.  Neurological: Negative for dizziness or headache.  No other specific complaints in a complete review of systems (except as listed in HPI above).  Objective  Virtual encounter, vitals not  obtained.  There is no height or weight on file to calculate BMI.  Physical Exam  Constitutional: Patient appears well-developed and well-nourished. Obese   PHQ2/9: Depression screen [facility] 2/9 01/11/2019 08/27/2018 05/03/2018 01/22/2018  Decreased Interest 0 0 0 0  Down, Depressed, Hopeless 0 0 0 0  PHQ - 2 Score 0 0 0 0  Altered sleeping 0 0 0 0  Tired, decreased energy 0 0 0 1  Change in appetite 0 0 0 0  Feeling bad or failure about yourself  0 0 0 0  Trouble concentrating 0 0 0 0  Moving slowly or fidgety/restless 0 0 0 0  Suicidal thoughts 0 0 0 0  PHQ-9 Score 0 0 0 1  Difficult doing work/chores - Not difficult at all Not difficult at all Not difficult at all   PHQ-2/9 Result is negative.    Fall Risk: Fall Risk  01/11/2019 10/01/2018 08/27/2018 08/06/2018 07/24/2018  Falls in the past year? 0 0 0 0 0  Number falls in past yr: 0 0 0 0 -  Injury with Fall? 0 0 0 0 -  Comment - - - - -  Follow up - - Falls evaluation completed - -    Assessment & Plan  1. 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 Essential hypertension  At goal   3. Multiple sclerosis ([place])  Advised to see neurologist   4. Low protein C activity level (HCC)   5. Morbid obesity ([place])  Continue weight watchers   I discussed the assessment and treatment plan with the patient. The patient was provided an opportunity to ask questions and all were answered. The patient  agreed with the plan and demonstrated an understanding of the instructions.  The patient was advised to call back or seek an in-person evaluation if the symptoms worsen or if the condition fails to improve as anticipated.  I provided 25 minutes of non-face-to-face time during this encounter.

## 2019-01-15 MED ORDER — WARFARIN SODIUM 6 MG PO TABS: 6.0000 mg | ORAL_TABLET | Freq: Every day | ORAL | 0 refills | Status: DC

## 2019-01-15 NOTE — Telephone Encounter (Signed)
Close please °

## 2019-01-18 ENCOUNTER — Ambulatory Visit (INDEPENDENT_AMBULATORY_CARE_PROVIDER_SITE_OTHER): Payer: BC Managed Care – PPO

## 2019-01-18 ENCOUNTER — Other Ambulatory Visit: Payer: Self-pay | Admitting: Family Medicine

## 2019-01-18 ENCOUNTER — Other Ambulatory Visit: Payer: Self-pay

## 2019-01-18 DIAGNOSIS — I2692 Saddle embolus of pulmonary artery without acute cor pulmonale: Secondary | ICD-10-CM

## 2019-01-18 DIAGNOSIS — Z86718 Personal history of other venous thrombosis and embolism: Secondary | ICD-10-CM

## 2019-01-18 LAB — POCT GLYCOSYLATED HEMOGLOBIN (HGB A1C): Hemoglobin A1C: 1.9 % — AB (ref 4.0–5.6)

## 2019-01-18 MED ORDER — WARFARIN SODIUM 6 MG PO TABS: 6.0000 mg | ORAL_TABLET | Freq: Every day | ORAL | 0 refills | Status: DC

## 2019-01-18 NOTE — Progress Notes (Signed)
Patient came in for POC PT/INR. Her INR was 1.9 and she was advised by PCP to continue to take 6 mg daily and take twice daily on Sunday.

## 2019-02-12 ENCOUNTER — Ambulatory Visit (INDEPENDENT_AMBULATORY_CARE_PROVIDER_SITE_OTHER): Payer: BC Managed Care – PPO

## 2019-02-12 ENCOUNTER — Other Ambulatory Visit: Payer: Self-pay

## 2019-02-12 DIAGNOSIS — I2692 Saddle embolus of pulmonary artery without acute cor pulmonale: Secondary | ICD-10-CM

## 2019-02-12 DIAGNOSIS — I2782 Chronic pulmonary embolism: Secondary | ICD-10-CM

## 2019-02-12 LAB — POCT INR: INR: 3.3 — AB (ref 2.0–3.0)

## 2019-02-12 NOTE — Patient Instructions (Signed)
Take 1 tablet (6 mg total) by mouth daily of Coumadin. And half of one on Sunday. Return in 2 weeks.

## 2019-04-15 ENCOUNTER — Other Ambulatory Visit: Payer: Self-pay

## 2019-04-15 ENCOUNTER — Ambulatory Visit: Payer: BC Managed Care – PPO | Admitting: Family Medicine

## 2019-04-15 ENCOUNTER — Encounter: Payer: Self-pay | Admitting: Family Medicine

## 2019-04-15 VITALS — BP 132/84 | HR 85 | Temp 96.9°F | Resp 18 | Ht 67.0 in | Wt 329.4 lb

## 2019-04-15 DIAGNOSIS — I2692 Saddle embolus of pulmonary artery without acute cor pulmonale: Secondary | ICD-10-CM

## 2019-04-15 DIAGNOSIS — G35 Multiple sclerosis: Secondary | ICD-10-CM

## 2019-04-15 DIAGNOSIS — Z86718 Personal history of other venous thrombosis and embolism: Secondary | ICD-10-CM

## 2019-04-15 DIAGNOSIS — D500 Iron deficiency anemia secondary to blood loss (chronic): Secondary | ICD-10-CM

## 2019-04-15 DIAGNOSIS — I2782 Chronic pulmonary embolism: Secondary | ICD-10-CM

## 2019-04-15 DIAGNOSIS — Z7901 Long term (current) use of anticoagulants: Secondary | ICD-10-CM

## 2019-04-15 DIAGNOSIS — I10 Essential (primary) hypertension: Secondary | ICD-10-CM

## 2019-04-15 DIAGNOSIS — D6859 Other primary thrombophilia: Secondary | ICD-10-CM | POA: Diagnosis not present

## 2019-04-15 DIAGNOSIS — E559 Vitamin D deficiency, unspecified: Secondary | ICD-10-CM

## 2019-04-15 DIAGNOSIS — Z5181 Encounter for therapeutic drug level monitoring: Secondary | ICD-10-CM | POA: Diagnosis not present

## 2019-04-15 DIAGNOSIS — E785 Hyperlipidemia, unspecified: Secondary | ICD-10-CM

## 2019-04-15 LAB — POCT INR: INR: 1.9 — AB (ref 2.0–3.0)

## 2019-04-15 MED ORDER — APIXABAN 5 MG PO TABS: 5.0000 mg | ORAL_TABLET | Freq: Two times a day (BID) | ORAL | 2 refills | Status: DC

## 2019-04-15 NOTE — Progress Notes (Signed)
Name: Morgan Craig   MRN: [id number]    DOB: 10/15/1960   Date:04/16/2019       Progress Note  Subjective  Chief Complaint  Chief Complaint  Patient presents with  . 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 Coagulation Disorder    Needs Refill    HPI   INR: had neck fusionon 05/21/2018, she started to have headache and one episode of SOB about one month after surgery.Seen by hematologist and was told to be on coumadin for  6 months, however is now under the care of pulmonologist Dr. Fredrich Birks that was managing her INR. Dr. Fredrich Birks wants her to stay on coumadin for the rest of her life. She has difficulty taking time off work.Last VQ scan was done earlier this year at [facility] with poor perfusion on right lower lung field. Today we discussed options and she is willing to try Eliquis   HTN: bpis at goal today, denies chest pain, dizziness  or palpitation  MS: still getting Ocrevus injection,  at [facility] every 6 months, continues to have some tingling on both legs, . She continues to have weakness on quads, uses a cane to assist with gait.She has follow at [facility] in Dec 2020, discussed PT referral and home exercises, she does not want to have PT at this time   Iron deficiency anemia: explained importance of rechecking labs, she has not been taking iron supplements because it causes constipation, advised to add colace  Hyperlipidemia: we will recheck labs, not taking statin therapy   Morbid obesity: she has a very high BMI, discussed dietary modification, also stationary exercises to help her lose weight.   Patient Active Problem List   Diagnosis Date Noted  . Chronic pulmonary embolism ([place]) 07/24/2018  . History of spinal fusion 05/21/2018  . Essential hypertension 01/22/2018  . Morbid obesity with body mass index of 50.0-59.9 in adult ([place]) 01/22/2018  . Pre-diabetes 01/22/2018  . Intramural leiomyoma of uterus 11/30/2017  . Iron deficiency anemia due to chronic blood  loss 09/25/2017  . Low protein C activity level ([place]) 09/25/2017  . Multiple sclerosis ([place]) 04/15/2017  . Vitamin D deficiency 04/15/2017  . Low back pain radiating to both legs 07/28/2015  . History of colonic polyps 02/12/2014  . Personal history of other venous thrombosis and embolism 01/03/2013  . Bilateral leg weakness 11/20/2012  . Hyperlipidemia with target LDL less than 100 10/27/2011  . Presbyopia 05/10/2011  . Microalbuminuria 04/22/2011  . Obstructive sleep apnea 01/15/2011    Past Surgical History:  Procedure Laterality Date  . COLONOSCOPY  2017   Found 1 polyp-[place]    Family History  Problem Relation Age of Onset  . Heart attack Mother   . Ulcers Mother        Legs  . Hypertension Mother   . COPD Father        Tobacco user  . Diabetes Mellitus II Father        Later on in life  . Cataracts Father   . Stroke Sister   . Anxiety disorder Sister   . Drug abuse Brother   . Alcohol abuse Brother   . Hypertension Sister   . Anxiety disorder Sister     Social History   Socioeconomic History  . Marital status: Single    Spouse name: Not on file  . Number of children: 0  . Years of education: Not on file  . Highest education level: Associate degree: occupational, technical,  or vocational program  Occupational History  . Occupation: Arboriculturist Needs  . Financial resource strain: Not hard at all  . Food insecurity    Worry: Never true    Inability: Never true  . Transportation needs    Medical: No    Non-medical: No  Tobacco Use  . Smoking status: Never Smoker  . Smokeless tobacco: Never Used  Substance and Sexual Activity  . Alcohol use: Yes    Comment: occasionally  . Drug use: Never  . Sexual activity: Not Currently    Partners: Male  Lifestyle  . Physical activity    Days per week: 0 days    Minutes per session: 0 min  . Stress: Not at all  Relationships  . Social connections    Talks on phone: More than three times a  week    Gets together: More than three times a week    Attends religious service: Never    Active member of club or organization: No    Attends meetings of clubs or organizations: Never    Relationship status: Never married  . Intimate partner violence    Fear of current or ex partner: No    Emotionally abused: No    Physically abused: No    Forced sexual activity: No  Other Topics Concern  . Not on file  Social History Narrative   Lives in [place] with her sister     Current Outpatient Medications:  .  acetaminophen (TYLENOL) 500 MG tablet, Take 500 mg by mouth every 6 (six) hours as needed., Disp: , Rfl:  .  losartan (COZAAR) 25 MG tablet, Take 1 tablet (25 mg total) by mouth daily., Disp: 90 tablet, Rfl: 1 .  apixaban (ELIQUIS) 5 MG TABS tablet, Take 1 tablet (5 mg total) by mouth 2 (two) times daily., Disp: 60 tablet, Rfl: 2 .  ferrous sulfate 325 (65 FE) MG tablet, Take 1 tablet (325 mg total) by mouth daily with breakfast. (Patient not taking: Reported on 04/15/2019), Disp: 30 tablet, Rfl: 0  Allergies  Allergen Reactions  . Gabapentin     Tic disorder  . Metformin And Related     Not sure  . Amoxicillin-Pot Clavulanate Nausea And Vomiting    I personally reviewed active problem list, medication list, allergies, family history, social history, health maintenance with the patient/caregiver today.   ROS  Constitutional: Negative for fever or weight change.  Respiratory: Negative for cough and shortness of breath.   Cardiovascular: Negative for chest pain or palpitations.  Gastrointestinal: Negative for abdominal pain, no bowel changes.  Musculoskeletal: Negative for gait problem or joint swelling.  Skin: Negative for rash.  Neurological: Negative for dizziness or headache.  No other specific complaints in a complete review of systems (except as listed in HPI above).  Objective  Vitals:   04/15/19 1609  BP: 132/84  Pulse: 85  Resp: 18  Temp: (!) 96.9 F (36.1  C)  TempSrc: Temporal  SpO2: 95%  Weight: (!) 329 lb 6.4 oz (149.4 kg)  Height: 5\' 7"  (1.702 m)    Body mass index is 51.59 kg/m.  Physical Exam  Constitutional: Patient appears well-developed and well-nourished. Obese  No distress.  HEENT: head atraumatic, normocephalic, pupils equal and reactive to light Cardiovascular: Normal rate, regular rhythm and normal heart sounds.  No murmur heard. No BLE edema. Pulmonary/Chest: Effort normal and breath sounds normal. No respiratory distress. Abdominal: Soft.  There is no tenderness. Psychiatric: Patient has a  normal mood and affect. behavior is normal. Judgment and thought content normal. Muscular skeletal: using a cane to assist with gait  Recent Results (from the past 2160 hour(s))  POCT HgB A1C     Status: Abnormal   Collection Time: 01/18/19 11:27 AM  Result Value Ref Range   Hemoglobin A1C 1.9 (A) 4.0 - 5.6 %   HbA1c POC (<> result, manual entry)     HbA1c, POC (prediabetic range)     HbA1c, POC (controlled diabetic range)    POCT INR     Status: Abnormal   Collection Time: 02/12/19  4:17 PM  Result Value Ref Range   INR 3.3 (A) 2.0 - 3.0  POCT INR     Status: Abnormal   Collection Time: 04/15/19  4:17 PM  Result Value Ref Range   INR 1.9 (A) 2.0 - 3.0     PHQ2/9: Depression screen [facility] 2/9 04/15/2019 01/11/2019 08/27/2018 05/03/2018 01/22/2018  Decreased Interest 0 0 0 0 0  Down, Depressed, Hopeless 0 0 0 0 0  PHQ - 2 Score 0 0 0 0 0  Altered sleeping 0 0 0 0 0  Tired, decreased energy 0 0 0 0 1  Change in appetite 0 0 0 0 0  Feeling bad or failure about yourself  0 0 0 0 0  Trouble concentrating 0 0 0 0 0  Moving slowly or fidgety/restless 0 0 0 0 0  Suicidal thoughts 0 0 0 0 0  PHQ-9 Score 0 0 0 0 1  Difficult doing work/chores Not difficult at all - Not difficult at all Not difficult at all Not difficult at all    phq 9 is negative   Fall Risk: Fall Risk  04/15/2019 01/11/2019 10/01/2018 08/27/2018 08/06/2018   Falls in the past year? 0 0 0 0 0  Number falls in past yr: 0 0 0 0 0  Injury with Fall? 0 0 0 0 0  Comment - - - - -  Follow up - - - Falls evaluation completed -     Functional Status Survey: Is the patient deaf or have difficulty hearing?: No Does the patient have difficulty seeing, even when wearing glasses/contacts?: No Does the patient have difficulty concentrating, remembering, or making decisions?: No Does the patient have difficulty walking or climbing stairs?: Yes Does the patient have difficulty dressing or bathing?: No Does the patient have difficulty doing errands alone such as visiting a doctor's office or shopping?: No   Assessment & Plan   1. 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 Encounter for monitoring Coumadin therapy  - POCT INR  3. Multiple sclerosis ([place])  Under the care of neurologist   4. Low protein C activity level (HCC)  Needs to stay on anti-coagulant   5. Morbid obesity ([place])  Discussed with the patient the risk posed by an increased BMI. Discussed importance of portion control, calorie counting and at least 150 minutes of physical activity weekly. Avoid sweet beverages and drink more water. Eat at least 6 servings of fruit and vegetables daily   6. Vitamin D deficiency  - VITAMIN D 25 Hydroxy (Vit-D Deficiency, Fractures)  7. History of deep venous thrombosis (DVT) of distal vein of left lower extremity  - apixaban (ELIQUIS) 5 MG TABS tablet; Take 1 tablet (5 mg total) by mouth 2 (two) times daily.  Dispense: 60 tablet; Refill: 2  8.  Iron deficiency anemia due to chronic blood loss  - CBC with Differential/Platelet - Iron, TIBC and Ferritin Panel - Vitamin B12  9. Essential hypertension  - COMPLETE METABOLIC PANEL WITH GFR  10. Hyperlipidemia with target LDL less than 100  - Lipid panel

## 2019-04-23 ENCOUNTER — Encounter: Payer: Self-pay | Admitting: Family Medicine

## 2019-04-23 NOTE — Telephone Encounter (Signed)
Copied from [place] [id number]. Topic: Quick Communication - See Telephone Encounter >> Apr 16, 2019  9:40 AM Loma Boston wrote: CRM for notification. See Telephone encounter for: 04/16/19. Pt request the blood work to be done at [facility], when she went to [facility] they stated they could not take the [facility] form that they need an actual prescription. Please have nurse follow up on the easiest quickest method to get this done [phone].

## 2019-05-01 ENCOUNTER — Encounter: Payer: Self-pay | Admitting: Family Medicine

## 2019-05-01 LAB — LIPID PANEL
Cholesterol: 182 (ref 0–200)
HDL: 41 (ref 35–70)
LDL Cholesterol: 112
Triglycerides: 147 (ref 40–160)

## 2019-05-01 LAB — BASIC METABOLIC PANEL
BUN: 16 (ref 4–21)
Creatinine: 0.9 (ref 0.5–1.1)
Glucose: 99
Potassium: 4.2 (ref 3.4–5.3)
Sodium: 145 (ref 137–147)

## 2019-05-01 LAB — CBC AND DIFFERENTIAL
HCT: 30 — AB (ref 36–46)
Hemoglobin: 9.1 — AB (ref 12.0–16.0)
WBC: 5.7

## 2019-05-01 LAB — VITAMIN B12: Vitamin B-12: 630

## 2019-05-01 LAB — HEPATIC FUNCTION PANEL
ALT: 9 (ref 7–35)
AST: 15 (ref 13–35)
Alkaline Phosphatase: 89 (ref 25–125)

## 2019-05-28 ENCOUNTER — Other Ambulatory Visit: Payer: Self-pay | Admitting: Family Medicine

## 2019-05-28 DIAGNOSIS — I10 Essential (primary) hypertension: Secondary | ICD-10-CM

## 2019-06-25 ENCOUNTER — Other Ambulatory Visit: Payer: Self-pay | Admitting: Family Medicine

## 2019-06-25 DIAGNOSIS — I10 Essential (primary) hypertension: Secondary | ICD-10-CM

## 2019-07-15 ENCOUNTER — Other Ambulatory Visit: Payer: Self-pay | Admitting: Family Medicine

## 2019-07-15 DIAGNOSIS — Z86718 Personal history of other venous thrombosis and embolism: Secondary | ICD-10-CM

## 2019-07-23 ENCOUNTER — Other Ambulatory Visit: Payer: Self-pay | Admitting: Family Medicine

## 2019-07-23 DIAGNOSIS — I10 Essential (primary) hypertension: Secondary | ICD-10-CM

## 2019-08-14 ENCOUNTER — Other Ambulatory Visit: Payer: Self-pay

## 2019-08-14 ENCOUNTER — Telehealth: Payer: Self-pay

## 2019-08-14 ENCOUNTER — Ambulatory Visit (INDEPENDENT_AMBULATORY_CARE_PROVIDER_SITE_OTHER): Payer: BC Managed Care – PPO | Admitting: Family Medicine

## 2019-08-14 ENCOUNTER — Encounter: Payer: Self-pay | Admitting: Family Medicine

## 2019-08-14 VITALS — Ht 67.0 in | Wt 329.0 lb

## 2019-08-14 DIAGNOSIS — D251 Intramural leiomyoma of uterus: Secondary | ICD-10-CM

## 2019-08-14 DIAGNOSIS — D500 Iron deficiency anemia secondary to blood loss (chronic): Secondary | ICD-10-CM

## 2019-08-14 DIAGNOSIS — Z7901 Long term (current) use of anticoagulants: Secondary | ICD-10-CM

## 2019-08-14 DIAGNOSIS — N920 Excessive and frequent menstruation with regular cycle: Secondary | ICD-10-CM

## 2019-08-14 MED ORDER — FUSION 65-65-25-30 MG PO CAPS: 1.0000 | ORAL_CAPSULE | Freq: Every day | ORAL | 5 refills | Status: DC

## 2019-08-14 NOTE — Progress Notes (Signed)
Name: Morgan Craig   MRN: [id number]    DOB: [birth year]   Date:08/14/2019       Progress Note  Subjective:    Chief Complaint  Chief Complaint  Patient presents with  . Anemia    abnormal critical labs, pt states no blood in bowel movements, but just had period and was very heavy.  She states does have fibroids    I connected with  [name] on 08/14/19 at  4:00 PM EST by telephone and verified that I am speaking with the correct person using two identifiers.   I discussed the limitations, risks, security and privacy concerns of performing an evaluation and management service by telephone and the availability of in person appointments. Staff also discussed with the patient that there may be a patient responsible charge related to this service. Patient Location: home Provider Location: [facility] clinic Additional Individuals present: noen  HPI   Pt went to [facility] today for her infusion for her MS.  Her H/H was lower than a few months ago, it was found with her screening labs and was faxed to our clinic here, her PCP is on vacation so lab work was given to me and patient was asked if she could do a visit today to discuss her drop in hemoglobin.   [facility] labs faxed over show H/H 7.4/26.4 today and last labs through [facility] and this EMR on 05/01/2019 H/H 9.1/29.7  Patient has known history of iron deficiency anemia secondary to chronic blood loss, treated with over-the-counter iron, she also has history of leiomyoma intramural uterine fibroids, LMP was 08/07/2019, which she states was very heavy.  She has a regular cycle, has GYN at [facility] but her last appointment was deferred due to Covid pandemic and a shelter in place order.    Pt is feeling tired, but not much different than her normal energy level.  She is on eliquis for DVT/PE  She stopped taking OTC iron pills due to SE of constipation  She denies any CP, SOB, rapid heart rate, near syncope, syncope, weakness, fatigue, pallor, cold  intolerance She further denies melena, hematochezia.  Patient Active Problem List   Diagnosis Date Noted  . Chronic pulmonary embolism ([place]) 07/24/2018  . History of spinal fusion 05/21/2018  . Essential hypertension 01/22/2018  . Morbid obesity with body mass index of 50.0-59.9 in adult ([place]) 01/22/2018  . Pre-diabetes 01/22/2018  . Intramural leiomyoma of uterus 11/30/2017  . Iron deficiency anemia due to chronic blood loss 09/25/2017  . Low protein C activity level ([place]) 09/25/2017  . Multiple sclerosis ([place]) 04/15/2017  . Vitamin D deficiency 04/15/2017  . Low back pain radiating to both legs 07/28/2015  . History of colonic polyps 02/12/2014  . Personal history of other venous thrombosis and embolism 01/03/2013  . Bilateral leg weakness 11/20/2012  . Hyperlipidemia with target LDL less than 100 10/27/2011  . Presbyopia 05/10/2011  . Microalbuminuria 04/22/2011  . Obstructive sleep apnea 01/15/2011    Social History   Tobacco Use  . Smoking status: Never Smoker  . Smokeless tobacco: Never Used  Substance Use Topics  . Alcohol use: Yes    Comment: occasionally     Current Outpatient Medications:  .  acetaminophen (TYLENOL) 500 MG tablet, Take 500 mg by mouth every 6 (six) hours as needed., Disp: , Rfl:  .  ELIQUIS 5 MG TABS tablet, Take 1 tablet (5 mg total) by mouth 2 (two) times daily., Disp: 60 tablet, Rfl: 2 .  losartan (COZAAR) 25 MG tablet, Take 1 tablet (25 mg total) by mouth daily., Disp: 30 tablet, Rfl: 0 .  methocarbamol (ROBAXIN) 500 MG tablet, Take by mouth., Disp: , Rfl:  .  ferrous sulfate 325 (65 FE) MG tablet, Take 1 tablet (325 mg total) by mouth daily with breakfast. (Patient not taking: Reported on 04/15/2019), Disp: 30 tablet, Rfl: 0  Allergies  Allergen Reactions  . Gabapentin     Tic disorder  . Metformin And Related     Not sure  . Amoxicillin-Pot Clavulanate Nausea And Vomiting   I personally reviewed active problem list, medication list,  allergies, family history, social history, health maintenance, notes from last encounter, lab results, imaging with the patient/caregiver today.  Review of Systems  Constitutional: Negative.  Negative for activity change, appetite change, chills, diaphoresis, fatigue, fever and unexpected weight change.  HENT: Negative.   Eyes: Negative.   Respiratory: Negative.  Negative for shortness of breath.   Cardiovascular: Negative.  Negative for chest pain and palpitations.  Gastrointestinal: Negative.   Endocrine: Negative.   Genitourinary: Negative.   Musculoskeletal: Negative.   Skin: Negative.  Negative for color change and pallor.  Allergic/Immunologic: Negative.   Neurological: Negative.  Negative for dizziness, syncope, weakness, light-headedness and numbness.  Hematological: Negative.   Psychiatric/Behavioral: Negative.   All other systems reviewed and are negative.    Objective:    Virtual encounter, vitals limited, only able to obtain the following Today's Vitals   08/14/19 1622  Weight: (!) 329 lb (149.2 kg)  Height: 5\' 7"  (1.702 m)   Body mass index is 51.53 kg/m. Nursing Note and Vital Signs reviewed.  Physical Exam Patient alert, answering questions appropriately, normal phonation, no audible wheeze or stridor, good mood PE limited by telephone encounter  No results found for this or any previous visit (from the past 72 hour(s)).  Assessment and Plan:   1. Iron deficiency anemia due to chronic blood loss Patient had a drop in hemoglobin from 9.1-7.4 likely secondary to heavy menses. Pt denies sx and she denies melena or hematochezia.  Appears that her baseline hemoglobin is 8-10, patient is also anticoagulated with Eliquis We will recheck her labs in 1 to 2 weeks, she was encouraged to try over-the-counter oral iron supplementation, if she has been unable to tolerate daily iron supplements she was encouraged to try at least 3 times a week.  I did send in fusion  brand of iron supplement if she is able to get she may tolerate that better  Reviewed with her extensively signs and symptoms that are concerning for symptomatic anemia and she should go right to the ER she did verbalize understanding  She is going to follow-up with GYN and I did refer her to hematology for possible iron infusion treatments  Additionally patient does not want to come here or go to [place] for lab work she will call us back and give [place] a number which we can fax her blood orders to [facility] where she works because it is much more convenient for her  Rx for Fe Fum-Fe Poly-Vit C-Lactobac (FUSION) 65-65-25-30 MG CAPS - to take one daily  - CBC w/ Diff - Ferritin - Iron - IBC panel - Ambulatory referral to Hematology  2. Menorrhagia with regular cycle - CBC w/ Diff - Ambulatory referral to Gynecology  3. On continuous oral anticoagulation For chronic DVT/PE, no other bleeding concerns, no spontaneous bruising  4. Intramural leiomyoma of uterus - Ambulatory referral to  Gynecology   -Red flags and when to present for emergency care or RTC including fever >101.60F, chest pain, shortness of breath, new/worsening/un-resolving symptoms,  reviewed with patient at time of visit. Follow up and care instructions discussed and provided in AVS. - I discussed the assessment and treatment plan with the patient. The patient was provided an opportunity to ask questions and all were answered. The patient agreed with the plan and demonstrated an understanding of the instructions.  - The patient was advised to call back or seek an in-person evaluation if the symptoms worsen or if the condition fails to improve as anticipated.  I provided 18 minutes of non-face-to-face time during this encounter.  Delsa Grana, PA-C 08/14/19 4:34 PM

## 2019-08-14 NOTE — Telephone Encounter (Signed)
Copied from [place] [id number]. Topic: General - Other >> Aug 14, 2019 11:44 AM Rainey Pines A wrote: Patient had labs done today at neurology office and the office called to inform Dr. Ancil Boozer that patient has a critical cbc results and wants to make sure that Dr .Ancil Boozer  was aware of Hemoglobin was 7.4 and hematocrit 26.4. Results have been faxed to office. Please Arnoldo Hooker

## 2019-08-15 ENCOUNTER — Encounter: Payer: Self-pay | Admitting: Family Medicine

## 2019-08-15 NOTE — Telephone Encounter (Signed)
Patient had a visit with Leisa on yesterday, 08/13/2019 due to critical labs and referral to hematology and gyn was placed.

## 2019-08-27 ENCOUNTER — Other Ambulatory Visit: Payer: Self-pay | Admitting: Family Medicine

## 2019-08-27 DIAGNOSIS — I10 Essential (primary) hypertension: Secondary | ICD-10-CM

## 2019-08-27 NOTE — Telephone Encounter (Signed)
Requested Prescriptions  Pending Prescriptions Disp Refills  . losartan (COZAAR) 25 MG tablet [Pharmacy Med Name: losartan 25 mg tablet (COZAAR)] 30 tablet 0    Sig: Take 1 tablet (25 mg total) by mouth daily.     Cardiovascular:  Angiotensin Receptor Blockers Passed - 08/27/2019  8:42 AM      Passed - Cr in normal range and within 180 days    Creatinine  Date Value Ref Range Status  05/01/2019 0.9 0.5 - 1.1 Final   Creat  Date Value Ref Range Status  01/22/2018 0.90 0.50 - 1.05 mg/dL Final    Comment:    For patients >71 years of age, the reference limit for Creatinine is approximately 13% higher for people identified as African-American. .          Passed - K in normal range and within 180 days    Potassium  Date Value Ref Range Status  05/01/2019 4.2 3.4 - 5.3 Final         Passed - Patient is not pregnant      Passed - Last BP in normal range    BP Readings from Last 1 Encounters:  04/15/19 132/84         Passed - Valid encounter within last 6 months    Recent Outpatient Visits          1 week ago Iron deficiency anemia due to chronic blood loss   [facility] Delsa Grana, PA-C   4 months ago Multiple sclerosis [facility])   [facility] [place], Drue Stager, MD   7 months ago 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

## 2019-09-26 ENCOUNTER — Telehealth: Payer: Self-pay | Admitting: Family Medicine

## 2019-09-26 DIAGNOSIS — Z86718 Personal history of other venous thrombosis and embolism: Secondary | ICD-10-CM

## 2019-09-26 DIAGNOSIS — I10 Essential (primary) hypertension: Secondary | ICD-10-CM

## 2019-09-26 NOTE — Telephone Encounter (Signed)
Requested medications are due for refill today?  Yes  Requested medications are on active medication list?  Yes  Last Refill:   07/15/2019 #60 with 2 refills  Future visit scheduled?  no  Notes to Clinic:    Patient receiving iron infusions at [facility].  Noted labs done at [facility] in [place] on 09/20/2019 of Hgb 8.8 / HCT 30.8 / Plts 373.

## 2019-09-26 NOTE — Telephone Encounter (Signed)
Requested Prescriptions  Pending Prescriptions Disp Refills  . ELIQUIS 5 MG TABS tablet [Pharmacy Med Name: Eliquis 5 mg tablet (apixaban)] 60 tablet 2    Sig: Take 1 tablet (5 mg total) by mouth 2 (two) times daily.     Hematology:  Anticoagulants Failed - 09/26/2019  3:38 PM      Failed - HGB in normal range and within 360 days    Hemoglobin  Date Value Ref Range Status  05/01/2019 9.1 (A) 12.0 - 16.0 Final         Failed - PLT in normal range and within 360 days    Platelets  Date Value Ref Range Status  01/22/2018 376 140 - 400 Thousand/uL Final         Failed - HCT in normal range and within 360 days    HCT  Date Value Ref Range Status  05/01/2019 30 (A) 36 - 46 Final         Passed - Cr in normal range and within 360 days    Creatinine  Date Value Ref Range Status  05/01/2019 0.9 0.5 - 1.1 Final   Creat  Date Value Ref Range Status  01/22/2018 0.90 0.50 - 1.05 mg/dL Final    Comment:    For patients >20 years of age, the reference limit for Creatinine is approximately 13% higher for people identified as African-American. 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 losartan (COZAAR) 25 MG tablet [Pharmacy Med Name: losartan 25 mg tablet (COZAAR)] 30 tablet 2    Sig: Take 1 tablet (25 mg total) by mouth daily.     Cardiovascular:  Angiotensin Receptor  Blockers Passed - 09/26/2019  3:38 PM      Passed - Cr in normal range and within 180 days    Creatinine  Date Value Ref Range Status  05/01/2019 0.9 0.5 - 1.1 Final   Creat  Date Value Ref Range Status  01/22/2018 0.90 0.50 - 1.05 mg/dL Final    Comment:    For patients >60 years of age, the reference limit for Creatinine is approximately 13% higher for people identified as African-American. .          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  As a result, #30 with 2 refills ordered.

## 2019-09-30 NOTE — Telephone Encounter (Signed)
lvm for scheduling °

## 2019-12-30 ENCOUNTER — Other Ambulatory Visit: Payer: Self-pay | Admitting: Family Medicine

## 2019-12-30 DIAGNOSIS — I10 Essential (primary) hypertension: Secondary | ICD-10-CM

## 2020-01-16 ENCOUNTER — Telehealth: Payer: Self-pay | Admitting: Family Medicine

## 2020-01-16 NOTE — Telephone Encounter (Signed)
Pt is calling and was told to reach out to her PCP . Pt is having facet injection in her back on 01-20-2020. Pt would like to know when should she stop taking eliquis? Pt did reach out to the md that will be doing the injection and was told to call her PCP

## 2020-01-17 NOTE — Telephone Encounter (Signed)
Pt.notified

## 2020-01-17 NOTE — Telephone Encounter (Signed)
Please advise 

## 2020-01-23 ENCOUNTER — Other Ambulatory Visit: Payer: Self-pay | Admitting: Family Medicine

## 2020-01-23 DIAGNOSIS — Z86718 Personal history of other venous thrombosis and embolism: Secondary | ICD-10-CM

## 2020-02-15 ENCOUNTER — Encounter: Payer: Self-pay | Admitting: Family Medicine

## 2020-02-28 ENCOUNTER — Other Ambulatory Visit: Payer: Self-pay

## 2020-02-28 ENCOUNTER — Other Ambulatory Visit: Payer: Self-pay | Admitting: Family Medicine

## 2020-02-28 DIAGNOSIS — Z86718 Personal history of other venous thrombosis and embolism: Secondary | ICD-10-CM

## 2020-02-28 DIAGNOSIS — I10 Essential (primary) hypertension: Secondary | ICD-10-CM

## 2020-02-28 NOTE — Telephone Encounter (Signed)
Requested medication (s) are due for refill today: yes  Requested medication (s) are on the active medication list: yes  Last refill:  12/30/2019  Future visit scheduled: no  Notes to clinic:  patient is aware that she needs appointment and has not scheduled Please advise    Requested Prescriptions  Pending Prescriptions Disp Refills   losartan (COZAAR) 25 MG tablet [Pharmacy Med Name: losartan 25 mg tablet (COZAAR)] 60 tablet 0    Sig: Take 1 tablet (25 mg total) by mouth daily.      Cardiovascular:  Angiotensin Receptor Blockers Failed - 02/28/2020  8:23 AM      Failed - Cr in normal range and within 180 days    Creatinine  Date Value Ref Range Status  05/01/2019 0.9 0.5 - 1.1 Final   Creat  Date Value Ref Range Status  01/22/2018 0.90 0.50 - 1.05 mg/dL Final    Comment:    For patients >44 years of age, the reference limit for Creatinine is approximately 13% higher for people identified as African-American. .           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      Hematology:  Anticoagulants Failed - 02/28/2020  8:23 AM      Failed - HGB in normal range and within 360 days    Hemoglobin  Date Value Ref Range Status  05/01/2019 9.1 (A) 12.0 - 16.0 Final          Failed - PLT in normal range and within 360 days    Platelets  Date Value Ref Range Status  01/22/2018 376 140 - 400 Thousand/uL Final          Failed - HCT in normal range and within 360 days    HCT  Date Value Ref Range Status  05/01/2019 30 (A) 36 - 46 Final          Passed - Cr in normal range and within 360 days    Creatinine  Date Value Ref Range Status  05/01/2019 0.9 0.5 - 1.1 Final   Creat  Date Value Ref  Range Status  01/22/2018 0.90 0.50 - 1.05 mg/dL Final    Comment:    For patients >61 years of age, the reference limit for Creatinine is approximately 13% higher for people identified as African-American. 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

## 2020-03-02 MED ORDER — LOSARTAN POTASSIUM 25 MG PO TABS: 25.0000 mg | ORAL_TABLET | Freq: Every day | ORAL | 0 refills | Status: DC

## 2020-03-02 MED ORDER — APIXABAN 5 MG PO TABS: ORAL_TABLET | ORAL | 0 refills | Status: DC

## 2020-03-12 LAB — HM MAMMOGRAPHY: HM Mammogram: NORMAL (ref 0–4)

## 2020-03-23 ENCOUNTER — Ambulatory Visit: Payer: BC Managed Care – PPO | Admitting: Family Medicine

## 2020-03-23 ENCOUNTER — Encounter: Payer: Self-pay | Admitting: Family Medicine

## 2020-03-23 ENCOUNTER — Other Ambulatory Visit: Payer: Self-pay

## 2020-03-23 VITALS — BP 138/84 | HR 96 | Temp 98.3°F | Resp 16 | Ht 67.0 in | Wt 349.0 lb

## 2020-03-23 DIAGNOSIS — R600 Localized edema: Secondary | ICD-10-CM | POA: Diagnosis not present

## 2020-03-23 LAB — COMPLETE METABOLIC PANEL WITH GFR
AG Ratio: 1.4 (calc) (ref 1.0–2.5)
ALT: 10 U/L (ref 6–29)
AST: 11 U/L (ref 10–35)
Albumin: 3.7 g/dL (ref 3.6–5.1)
Alkaline phosphatase (APISO): 97 U/L (ref 37–153)
BUN: 16 mg/dL (ref 7–25)
CO2: 30 mmol/L (ref 20–32)
Calcium: 8.8 mg/dL (ref 8.6–10.4)
Chloride: 104 mmol/L (ref 98–110)
Creat: 1 mg/dL (ref 0.50–1.05)
GFR, Est African American: 72 mL/min/{1.73_m2} (ref >=60)
GFR, Est Non African American: 62 mL/min/{1.73_m2} (ref >=60)
Globulin: 2.7 g/dL (calc) (ref 1.9–3.7)
Glucose, Bld: 92 mg/dL (ref 65–99)
Potassium: 4.2 mmol/L (ref 3.5–5.3)
Sodium: 141 mmol/L (ref 135–146)
Total Bilirubin: 0.5 mg/dL (ref 0.2–1.2)
Total Protein: 6.4 g/dL (ref 6.1–8.1)

## 2020-03-23 NOTE — Patient Instructions (Signed)
Make sure to follow a strict low salt diet, use your compression stockings, elevated legs when able, avoid long periods of sitting or immobility.  Lasix will only help a little and only temporarily and have a lot of side effects - I will only send in a short supply to try - recommend only trying for 2-3 days for severe swelling, and do not recommend using frequently or daily for leg symptoms - the other recommendations are effective and don't have many side effects or problems they can cause.  We can refer you to vascular specialist if it is not improving, please follow up with your PCP   Edema  Edema is when you have too much fluid in your body or under your skin. Edema may make your legs, feet, and ankles swell up. Swelling is also common in looser tissues, like around your eyes. This is a common condition. It gets more common as you get older. There are many possible causes of edema. Eating too much salt (sodium) and being on your feet or sitting for a long time can cause edema in your legs, feet, and ankles. Hot weather may make edema worse. Edema is usually painless. Your skin may look swollen or shiny. Follow these instructions at home:  Keep the swollen body part raised (elevated) above the level of your heart when you are sitting or lying down.  Do not sit still or stand for a long time.  Do not wear tight clothes. Do not wear garters on your upper legs.  Exercise your legs. This can help the swelling go down.  Wear elastic bandages or support stockings as told by your doctor.  Eat a low-salt (low-sodium) diet to reduce fluid as told by your doctor.  Depending on the cause of your swelling, you may need to limit how much fluid you drink (fluid restriction).  Take over-the-counter and prescription medicines only as told by your doctor. Contact a doctor if:  Treatment is not working.  You have heart, liver, or kidney disease and have symptoms of edema.  You have sudden and  unexplained weight gain. Get help right away if:  You have shortness of breath or chest pain.  You cannot breathe when you lie down.  You have pain, redness, or warmth in the swollen areas.  You have heart, liver, or kidney disease and get edema all of a sudden.  You have a fever and your symptoms get worse all of a sudden. Summary  Edema is when you have too much fluid in your body or under your skin.  Edema may make your legs, feet, and ankles swell up. Swelling is also common in looser tissues, like around your eyes.  Raise (elevate) the swollen body part above the level of your heart when you are sitting or lying down.  Follow your doctor's instructions about diet and how much fluid you can drink (fluid restriction). This information is not intended to replace advice given to you by your health care provider. Make sure you discuss any questions you have with your health care provider. Document Revised: 08/04/2017 Document Reviewed: 08/19/2016 Elsevier Patient Education  2020 [employer].

## 2020-03-23 NOTE — Progress Notes (Signed)
Patient ID: Morgan Craig, female    DOB: Mar 05, 1961, 59 y.o.   MRN: [id number]  PCP: Steele Sizer, MD  Chief Complaint  Patient presents with  . Leg Pain    bilateral tightness and swelling    Subjective:   Morgan Craig is a 59 y.o. female, with pmhx of chronic PE, MS on lupron, low protein C activity, iron deficiency anemia, morbid obesity, microalbuminuria, prediabetes, and HTN presents to clinic with CC of bilateral leg swelling, skin tightness and pain.   She is on Eliquis 5 mg twice daily, compliant with medicine but no missed doses. Reviewed her current medications including her Lupron injection which does have a side effect of swelling.  She is also on losartan for blood pressure, is not on a diuretic.  She denies any chest pain, shortness of breath, orthopnea, palpitations, near syncope.  She denies any recent weight changes or weight gain.  No swelling to other places on her body.   Patient Active Problem List   Diagnosis Date Noted  . Chronic pulmonary embolism ([place]) 07/24/2018  . History of spinal fusion 05/21/2018  . Essential hypertension 01/22/2018  . Morbid obesity with body mass index of 50.0-59.9 in adult ([place]) 01/22/2018  . Pre-diabetes 01/22/2018  . Intramural leiomyoma of uterus 11/30/2017  . Iron deficiency anemia due to chronic blood loss 09/25/2017  . Low protein C activity level ([place]) 09/25/2017  . Multiple sclerosis ([place]) 04/15/2017  . Vitamin D deficiency 04/15/2017  . Low back pain radiating to both legs 07/28/2015  . History of colonic polyps 02/12/2014  . Personal history of other venous thrombosis and embolism 01/03/2013  . Bilateral leg weakness 11/20/2012  . Hyperlipidemia with target LDL less than 100 10/27/2011  . Presbyopia 05/10/2011  . Microalbuminuria 04/22/2011  . Obstructive sleep apnea 01/15/2011      Current Outpatient Medications:  .  acetaminophen (TYLENOL) 650 MG CR tablet, Take 650 mg by mouth every 8 (eight) hours as needed  for pain., Disp: , Rfl:  .  apixaban (ELIQUIS) 5 MG TABS tablet, Take 1 tablet (5 mg total) by mouth 2 (two) times daily., Disp: 90 tablet, Rfl: 0 .  leuprolide (LUPRON) 11.25 MG KIT injection, Inject into the muscle., Disp: , Rfl:  .  losartan (COZAAR) 25 MG tablet, Take 1 tablet (25 mg total) by mouth daily., Disp: 90 tablet, Rfl: 0 .  pregabalin (LYRICA) 50 MG capsule, Take by mouth., Disp: , Rfl:  .  methocarbamol (ROBAXIN) 500 MG tablet, Take by mouth. (Patient not taking: Reported on 03/23/2020), Disp: , Rfl:    Allergies  Allergen Reactions  . Gabapentin     Tic disorder  . Metformin And Related     Not sure  . Amoxicillin-Pot Clavulanate Nausea And Vomiting     Social History   Tobacco Use  . Smoking status: Never Smoker  . Smokeless tobacco: Never Used  Vaping Use  . Vaping Use: Never used  Substance Use Topics  . Alcohol use: Yes    Comment: occasionally  . Drug use: Never      Chart Review Today: I personally reviewed active problem list, medication list, allergies, family history, social history, health maintenance, notes from last encounter, lab results, imaging with the patient/caregiver today.   Review of Systems 10 Systems reviewed and are negative for acute change except as noted in the HPI.     Objective:   Vitals:   03/23/20 1136  BP: 138/84  Pulse: 96  Resp:  16  Temp: 98.3 F (36.8 C)  TempSrc: Temporal  SpO2: 98%  Weight: (!) 349 lb (158.3 kg)  Height: '5\' 7"'  (1.702 m)    Body mass index is 54.66 kg/m.  Physical Exam Vitals and nursing note reviewed.  Constitutional:      General: She is not in acute distress.    Appearance: Normal appearance. She is obese. She is not ill-appearing, toxic-appearing or diaphoretic.  HENT:     Head: Normocephalic and atraumatic.     Right Ear: External ear normal.     Left Ear: External ear normal.  Eyes:     General:        Right eye: No discharge.        Left eye: No discharge.  Cardiovascular:       Rate and Rhythm: Normal rate and regular rhythm.     Pulses: Normal pulses.     Heart sounds: Normal heart sounds. No murmur heard.  No friction rub. No gallop.   Pulmonary:     Effort: Pulmonary effort is normal. No respiratory distress.     Breath sounds: Normal breath sounds. No stridor. No wheezing, rhonchi or rales.  Abdominal:     General: Bowel sounds are normal.     Palpations: Abdomen is soft.  Musculoskeletal:     Right lower leg: Edema present.     Left lower leg: Edema present.  Skin:    General: Skin is warm and dry.     Coloration: Skin is not jaundiced or pale.     Findings: No bruising.  Neurological:     Mental Status: She is alert.  Psychiatric:        Mood and Affect: Mood normal.        Behavior: Behavior normal.      Results for orders placed or performed in visit on 03/23/20  HM MAMMOGRAPHY  Result Value Ref Range   HM Mammogram Self Reported Normal 0-4 Bi-Rad, Self Reported Normal       Assessment & Plan:     ICD-10-CM   1. Bilateral lower extremity edema  Z61.0 COMPLETE METABOLIC PANEL WITH GFR   L>R, improves over night, sits for long periods of time at her desk, no DVT, left leg weakness due to MS   Several months of lower extremity edema, patient's left leg is more swollen than her right leg but she does have weakness in her left leg due to MS. her swelling has seemed to be more frequent although it does improve when she elevates her legs overnight and does recur throughout the day with prolonged sitting at her desk.  She is on Eliquis for chronic pulmonary embolism, therefore I am not concerned with a new DVT.  Suspect lower extremity swelling is from stasis may be from vascular insufficiency?  Encourage patient to work on conservative measures including low-salt diet, frequently getting up from her desk and walking around, elevating legs and using compression stockings. We will check CMP today to make sure that her kidney function, liver  function and protein is normal -and ensure that a diuretic would be safe for her to try occasionally -however her symptoms are currently pretty mild and improve overnight I do suspect that they would improve with walking more often and with some compression stockings.    Instructions and handout given to the patient today on her AVS -of note, she does have compression stockings so this is a recurrent or chronic issue for her and she was  encouraged to use a compression stockings that she does have.  I did explain that diuretics will only help temporarily and will have a lot of side effects, if she does insist on using I encouraged her to use only 1-2 doses of Lasix at a time and only if and when her lower extremity edema is severe, pitting and last for several days despite conservative efforts.  Additionally currently have no concerns for CHF in this patient at this time  Delsa Grana, PA-C 03/23/20 11:49 AM

## 2020-05-09 ENCOUNTER — Encounter: Payer: Self-pay | Admitting: Family Medicine

## 2020-05-28 ENCOUNTER — Encounter: Payer: Self-pay | Admitting: Family Medicine

## 2020-06-10 ENCOUNTER — Encounter: Payer: Self-pay | Admitting: Family Medicine

## 2020-06-16 ENCOUNTER — Other Ambulatory Visit: Payer: Self-pay | Admitting: Family Medicine

## 2020-06-16 ENCOUNTER — Encounter: Payer: Self-pay | Admitting: Family Medicine

## 2020-06-16 DIAGNOSIS — I10 Essential (primary) hypertension: Secondary | ICD-10-CM

## 2020-06-16 MED ORDER — LOSARTAN POTASSIUM 25 MG PO TABS: 25.0000 mg | ORAL_TABLET | Freq: Every day | ORAL | 0 refills | Status: DC

## 2020-06-17 ENCOUNTER — Other Ambulatory Visit: Payer: Self-pay | Admitting: Family Medicine

## 2020-06-17 NOTE — Telephone Encounter (Signed)
Requested medication (s) are due for refill today: yes  Requested medication (s) are on the active medication list: yes  Last refill:  87/26/21  Future visit scheduled: yes  Notes to clinic:  Not delegated

## 2020-06-17 NOTE — Telephone Encounter (Signed)
Pt is calling checking on the status of tramadol . Pt has check with her pharm and they just have bp med. [facility]

## 2020-06-23 NOTE — Progress Notes (Signed)
Name: Morgan Craig   MRN: [id number]    DOB: 1961-05-24   Date:06/25/2020       Progress Note  Subjective  Chief Complaint  Follow up   HPI    Chronic  PE:  It happened after neck fusion back in  10/7/2019Seen by hematologist and was told to be on coumadin for  6 months, however after that she was seen by pulmonologist Dr. Fredrich Birks Dr. Fredrich Birks and was told to stay on anticoagulation for the rest of her life.  She has difficulty taking time off work. Last VQ scan was done earlier this year at [facility] with poor perfusion on right lower lung field 10/2018 . She is now on Eliquis and denies side effects   HTN: bpis elevated today, she is taking losartan low dose, and we will adjust dose today   MS: still getting Ocrevus injection,  at [facility] every 6 months, continues to have some tingling on both legs . She continues to have weakness on quads, uses a cane to assist with gait. Unchanged.    Hyperlipidemia: reviewed last labs, refuses statin therapy  The 10-year ASCVD risk score Mikey Bussing DC Jr., et al., 2013) is: 9.1%*   Values used to calculate the score:     Age: 59 years     Sex: Female     Is Non-Hispanic African American: Yes     Diabetic: No     Tobacco smoker: No     Systolic Blood Pressure: 086 mmHg     Is BP treated: Yes     HDL Cholesterol: 41 mg/dL*     Total Cholesterol: 182 mg/dL*     * - Cholesterol units were assumed for this score calculation  Morbid obesity: BMI above 50, she has been unable to be physically active   Dysthymia: she is frustrated, cried, she states she is in pain, unable to work because of daily pain, got worse since she stopped taking Lyrica   Chronic back pain: she is under the care of pain clinic at [facility]. She has back pain for the past 2 years from DDD lumbar spine. She states pain is increasing , difficulty getting dressed, putting shoes on, has to shift positions constantly to get comfortable, trying to get pain medications, explained she  needs to discuss with pain clinic, she is going to see therapist in Dec. She denies bowel or bladder incontinence, but has difficulty making to the bathroom because she gets up slowly due to back pain . She needs to seat down most of the day, she took a leave of absence Nov 1st 2021 until Nov 29 th, 2021, she is hoping that the nerve ablation that is scheduled for next week will help symptoms.   Patient Active Problem List   Diagnosis Date Noted  . Chronic pulmonary embolism ([place]) 07/24/2018  . History of spinal fusion 05/21/2018  . Essential hypertension 01/22/2018  . Morbid obesity with body mass index of 50.0-59.9 in adult ([place]) 01/22/2018  . Pre-diabetes 01/22/2018  . Intramural leiomyoma of uterus 11/30/2017  . Iron deficiency anemia due to chronic blood loss 09/25/2017  . Low protein C activity level ([place]) 09/25/2017  . Multiple sclerosis ([place]) 04/15/2017  . Vitamin D deficiency 04/15/2017  . Low back pain radiating to both legs 07/28/2015  . History of colonic polyps 02/12/2014  . Personal history of other venous thrombosis and embolism 01/03/2013  . History of deep venous thrombosis 01/03/2013  . Bilateral leg weakness 11/20/2012  . Hyperlipidemia  with target LDL less than 100 10/27/2011  . Presbyopia 05/10/2011  . Microalbuminuria 04/22/2011  . Obstructive sleep apnea 01/15/2011    Past Surgical History:  Procedure Laterality Date  . COLONOSCOPY  2017   Found 1 polyp-[place]    Family History  Problem Relation Age of Onset  . Heart attack Mother   . Ulcers Mother        Legs  . Hypertension Mother   . COPD Father        Tobacco user  . Diabetes Mellitus II Father        Later on in life  . Cataracts Father   . Stroke Sister   . Anxiety disorder Sister   . Drug abuse Brother   . Alcohol abuse Brother   . Hypertension Sister   . Anxiety disorder Sister     Social History   Tobacco Use  . Smoking status: Never Smoker  . Smokeless tobacco: Never Used   Substance Use Topics  . Alcohol use: Yes    Comment: occasionally     Current Outpatient Medications:  .  acetaminophen (TYLENOL) 650 MG CR tablet, Take 650 mg by mouth every 8 (eight) hours as needed for pain., Disp: , Rfl:  .  leuprolide (LUPRON) 11.25 MG KIT injection, Inject into the muscle., Disp: , Rfl:  .  losartan (COZAAR) 25 MG tablet, Take 1 tablet (25 mg total) by mouth daily., Disp: 30 tablet, Rfl: 0 .  traMADol (ULTRAM) 50 MG tablet, Take 50 mg by mouth every 6 (six) hours as needed., Disp: , Rfl:  .  pregabalin (LYRICA) 100 MG capsule, Take 100 mg by mouth 2 (two) times daily. (Patient not taking: Reported on 06/25/2020), Disp: , Rfl:   Allergies  Allergen Reactions  . Gabapentin     Tic disorder  . Metformin And Related     Not sure  . Amoxicillin-Pot Clavulanate Nausea And Vomiting    I personally reviewed active problem list, medication list, allergies, family history, social history, health maintenance with the patient/caregiver today.   ROS  Constitutional: Negative for fever or weight change.  Respiratory: Negative for cough and shortness of breath.   Cardiovascular: Negative for chest pain or palpitations.  Gastrointestinal: Negative for abdominal pain, no bowel changes.  Musculoskeletal: Positive  for gait problem or joint swelling.  Skin: Negative for rash.  Neurological: Negative for dizziness or headache.  No other specific complaints in a complete review of systems (except as listed in HPI above).  Objective  Vitals:   06/25/20 1049  BP: (!) 142/88  Pulse: 90  Resp: 17  Temp: 98.2 F (36.8 C)  TempSrc: Oral  SpO2: 100%  Weight: (!) 352 lb 14.4 oz (160.1 kg)  Height: _0  (1.702 m)    Body mass index is 55.27 kg/m.  Physical Exam  Constitutional: Patient appears well-developed and well-nourished. Obese  No distress.  HEENT: head atraumatic, normocephalic, pupils equal and reactive to light,  neck supple Cardiovascular: Normal rate,  regular rhythm and normal heart sounds.  No murmur heard. Postiive BLE edema. Pulmonary/Chest: Effort normal and breath sounds normal. No respiratory distress. Abdominal: Soft.  There is no tenderness. Psychiatric: Patient has a depressed mood, cried during the visit   PHQ2/9: Depression screen [facility] 2/9 06/25/2020 03/23/2020 08/14/2019 04/15/2019 01/11/2019  Decreased Interest 3 0 0 0 0  Down, Depressed, Hopeless 3 0 0 0 0  PHQ - 2 Score 6 0 0 0 0  Altered sleeping 0 0  0 0 0  Tired, decreased energy 2 0 0 0 0  Change in appetite 2 0 0 0 0  Feeling bad or failure about yourself  - 0 0 0 0  Trouble concentrating 0 0 0 0 0  Moving slowly or fidgety/restless 0 0 0 0 0  Suicidal thoughts 0 0 0 0 0  PHQ-9 Score 10 0 0 0 0  Difficult doing work/chores - Not difficult at all Not difficult at all Not difficult at all -    phq 9 is positive   Fall Risk: Fall Risk  06/25/2020 03/23/2020 08/14/2019 04/15/2019 01/11/2019  Falls in the past year? 0 1 0 0 0  Number falls in past yr: 0 0 0 0 0  Injury with Fall? 0 0 0 0 0  Comment - - - - -  Follow up - - - - -     Functional Status Survey: Is the patient deaf or have difficulty hearing?: No Does the patient have difficulty seeing, even when wearing glasses/contacts?: No Does the patient have difficulty concentrating, remembering, or making decisions?: No Does the patient have difficulty walking or climbing stairs?: Yes Does the patient have difficulty dressing or bathing?: Yes Does the patient have difficulty doing errands alone such as visiting a doctor's office or shopping?: Yes    Assessment & Plan  1. Morbid obesity ([place])  Discussed with the patient the risk posed by an increased BMI. Discussed importance of portion control, calorie counting and at least 150 minutes of physical activity weekly. Avoid sweet beverages and drink more water. Eat at least 6 servings of fruit and vegetables daily   2. History of deep venous thrombosis (DVT) of  distal vein of left lower extremity  On Eliquis   3. Essential hypertension  - losartan (COZAAR) 50 MG tablet; Take 1 tablet (50 mg total) by mouth daily.  Dispense: 90 tablet; Refill: 1  4. Bilateral lower extremity edema  Stable   5. Need for immunization against influenza  - Flu Vaccine QUAD 36+ mos IM  6. 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 Multiple sclerosis ([place])   8. Vitamin D deficiency   9. History of neck surgery   10. DDD (degenerative disc disease), lumbosacral  - DULoxetine (CYMBALTA) 30 MG capsule; Take 1-2 capsules (30-60 mg total) by mouth daily.  Dispense: 60 capsule; Refill: 0  11. Dysthymia  - DULoxetine (CYMBALTA) 30 MG capsule; Take 1-2 capsules (30-60 mg total) by mouth daily.  Dispense: 60 capsule; Refill: 0  12. Chronic pain syndrome  - DULoxetine (CYMBALTA) 30 MG capsule; Take 1-2 capsules (30-60 mg total) by mouth daily.  Dispense: 60 capsule; Refill: 0

## 2020-06-25 ENCOUNTER — Other Ambulatory Visit: Payer: Self-pay

## 2020-06-25 ENCOUNTER — Encounter: Payer: Self-pay | Admitting: Family Medicine

## 2020-06-25 ENCOUNTER — Ambulatory Visit: Payer: BC Managed Care – PPO | Admitting: Family Medicine

## 2020-06-25 DIAGNOSIS — Z86718 Personal history of other venous thrombosis and embolism: Secondary | ICD-10-CM | POA: Diagnosis not present

## 2020-06-25 DIAGNOSIS — Z23 Encounter for immunization: Secondary | ICD-10-CM | POA: Diagnosis not present

## 2020-06-25 DIAGNOSIS — F341 Dysthymic disorder: Secondary | ICD-10-CM

## 2020-06-25 DIAGNOSIS — I2692 Saddle embolus of pulmonary artery without acute cor pulmonale: Secondary | ICD-10-CM

## 2020-06-25 DIAGNOSIS — G35 Multiple sclerosis: Secondary | ICD-10-CM

## 2020-06-25 DIAGNOSIS — G894 Chronic pain syndrome: Secondary | ICD-10-CM

## 2020-06-25 DIAGNOSIS — R600 Localized edema: Secondary | ICD-10-CM | POA: Diagnosis not present

## 2020-06-25 DIAGNOSIS — M5137 Other intervertebral disc degeneration, lumbosacral region: Secondary | ICD-10-CM

## 2020-06-25 DIAGNOSIS — I10 Essential (primary) hypertension: Secondary | ICD-10-CM

## 2020-06-25 DIAGNOSIS — I2602 Saddle embolus of pulmonary artery with acute cor pulmonale: Secondary | ICD-10-CM

## 2020-06-25 DIAGNOSIS — I2782 Chronic pulmonary embolism: Secondary | ICD-10-CM

## 2020-06-25 DIAGNOSIS — Z9889 Other specified postprocedural states: Secondary | ICD-10-CM

## 2020-06-25 DIAGNOSIS — E559 Vitamin D deficiency, unspecified: Secondary | ICD-10-CM

## 2020-06-25 DIAGNOSIS — M51379 Other intervertebral disc degeneration, lumbosacral region without mention of lumbar back pain or lower extremity pain: Secondary | ICD-10-CM

## 2020-06-25 MED ORDER — LOSARTAN POTASSIUM 50 MG PO TABS: 50.0000 mg | ORAL_TABLET | Freq: Every day | ORAL | 1 refills | Status: DC

## 2020-06-25 MED ORDER — APIXABAN 5 MG PO TABS: 5.0000 mg | ORAL_TABLET | Freq: Two times a day (BID) | ORAL | 1 refills | Status: DC

## 2020-06-25 MED ORDER — DULOXETINE HCL 30 MG PO CPEP: 30.0000 mg | ORAL_CAPSULE | Freq: Every day | ORAL | 0 refills | Status: DC

## 2020-06-30 ENCOUNTER — Telehealth: Payer: Self-pay

## 2020-06-30 NOTE — Telephone Encounter (Signed)
Copied from [place] [id number]. Topic: Quick Communication - See Telephone Encounter >> Jun 29, 2020  3:31 PM [place] wrote: CRM for notification. See Telephone encounter for: 06/29/20. Broadus John with [employer] was wanting a clarification on end dates and beginning dates with this pt. Wants cb at [phone]. Anyone can handle.

## 2020-07-01 ENCOUNTER — Encounter: Payer: Self-pay | Admitting: Family Medicine

## 2020-07-30 ENCOUNTER — Other Ambulatory Visit: Payer: Self-pay | Admitting: Family Medicine

## 2020-07-30 DIAGNOSIS — G894 Chronic pain syndrome: Secondary | ICD-10-CM

## 2020-07-30 DIAGNOSIS — F341 Dysthymic disorder: Secondary | ICD-10-CM

## 2020-07-30 DIAGNOSIS — M5137 Other intervertebral disc degeneration, lumbosacral region: Secondary | ICD-10-CM

## 2020-09-04 ENCOUNTER — Encounter: Payer: Self-pay | Admitting: Family Medicine

## 2020-09-07 ENCOUNTER — Encounter: Payer: Self-pay | Admitting: Family Medicine

## 2020-09-07 ENCOUNTER — Telehealth: Payer: Self-pay

## 2020-09-07 NOTE — Telephone Encounter (Signed)
Copied from [place] [phone]. Topic: General - Other >> Sep 07, 2020 10:23 AM Antonieta Iba C wrote: Reason for CRM: pt called in for assistance. Pt says that she was advised via my-chart to call in to schedule a virtual with PCP for congestion, not showing any openings. Pt says that she would also like to follow up on short term disability form. Please advise/confirm receipt.   CB: [phone]-

## 2020-09-07 NOTE — Progress Notes (Signed)
Name: Morgan Craig   MRN: [id number]    DOB: 1961/05/12   Date:09/08/2020       Progress Note  Subjective  Chief Complaint  Cough/Congestion  I connected with  [name]  on 09/08/20 at 11:40 AM EST by a video enabled telemedicine application and verified that I am speaking with the correct person using two identifiers.  I discussed the limitations of evaluation and management by telemedicine and the availability of in person appointments. The patient expressed understanding and agreed to proceed with the virtual visit  Staff also discussed with the patient that there may be a patient responsible charge related to this service. Patient Location: at home  Provider Location: [facility] Additional Individuals present: sister   HPI  Dry cough: she states symptoms started about 10 ago , initially just dry cough and felt warm,but after that it became productive and white in color , she also developed some SOB with mild activity ( ie walking to the bathroom). She she is up to date with Flu vaccine, only had two shots of Moderma. She has a personal history of pneumonia. She states cough is not longer wet, but because it is lasting long she is worried about pneumonia. She denies body aches except for back pain/chronic , she has baseline fatigue. She denies change of sense of smell or taste.   Chronic back pain: she is under the care of pain clinic at [facility]. She has back pain for the past 2 years from DDD lumbar spine. She states pain is getting progressive worse, she had ablation back in Nov and returned to work in Dec, but she continues to have  difficulty getting dressed, putting shoes on, has to shift positions constantly to get comfortable. She denies bowel or bladder incontinence, but has difficulty making to the bathroom because she gets up slowly due to back pain . She needs to seat down most of the day, she took a leave of absence Nov 1st 2021 until Nov 29 th, 2021. She had nerve ablation in  Nov and she went back to work in December, but she continues to have back pain and hip pain. She had MRI done end of Dec. She stopped going to work on January 17 th, 2022. We will contact Dr. Jeneen Rinks to find out what is her recommendation in terms of return to work . The pain level is 10/10, mid to lower back, radiates to left hip. She states she has been using a cane or walker. She work at [employer] at work - she seats at work, she is unable to use stairs but has FMLA for that already  IMPRESSION/Dec 2022  L2-3 and L3-4 demonstrate significant osteoarthritis of the facet joints. L1-2 demonstrate significant osteoarthritis of the left facet joint. Mild progression of the osteoarthritis of the L2 and L3 facet joints are noted.   L2-3 and L3-4 demonstrate moderate degree of central spinal canal stenosis. Central spinal canal stenosis shows mild progression at L2-3 since preceding exam.   The degrees of neural foramen narrowing are noted in the lumbar spine some of which are progressive since preceding exam as discussed above.  Patient Active Problem List   Diagnosis Date Noted  . Chronic pulmonary embolism ([place]) 07/24/2018  . History of spinal fusion 05/21/2018  . Essential hypertension 01/22/2018  . Morbid obesity with body mass index of 50.0-59.9 in adult ([facility]) 01/22/2018  . Pre-diabetes 01/22/2018  . Intramural leiomyoma of uterus 11/30/2017  . Iron deficiency anemia due  to chronic blood loss 09/25/2017  . Low protein C activity level ([place]) 09/25/2017  . Multiple sclerosis ([place]) 04/15/2017  . Vitamin D deficiency 04/15/2017  . Low back pain radiating to both legs 07/28/2015  . History of colonic polyps 02/12/2014  . Personal history of other venous thrombosis and embolism 01/03/2013  . History of deep venous thrombosis 01/03/2013  . Bilateral leg weakness 11/20/2012  . Hyperlipidemia with target LDL less than 100 10/27/2011  . Presbyopia 05/10/2011  . Microalbuminuria 04/22/2011  .  Obstructive sleep apnea 01/15/2011    Past Surgical History:  Procedure Laterality Date  . COLONOSCOPY  2017   Found 1 polyp-[place]    Family History  Problem Relation Age of Onset  . Heart attack Mother   . Ulcers Mother        Legs  . Hypertension Mother   . COPD Father        Tobacco user  . Diabetes Mellitus II Father        Later on in life  . Cataracts Father   . Stroke Sister   . Anxiety disorder Sister   . Drug abuse Brother   . Alcohol abuse Brother   . Hypertension Sister   . Anxiety disorder Sister       Current Outpatient Medications:  .  acetaminophen (TYLENOL) 650 MG CR tablet, Take 650 mg by mouth every 8 (eight) hours as needed for pain., Disp: , Rfl:  .  apixaban (ELIQUIS) 5 MG TABS tablet, Take 1 tablet (5 mg total) by mouth 2 (two) times daily., Disp: 180 tablet, Rfl: 1 .  DULoxetine (CYMBALTA) 30 MG capsule, Take 1 capsule by mouth once daily for 1 week, then increase to 2 capsules once daily, Disp: 60 capsule, Rfl: 2 .  leuprolide (LUPRON) 11.25 MG KIT injection, Inject into the muscle., Disp: , Rfl:  .  losartan (COZAAR) 50 MG tablet, Take 1 tablet (50 mg total) by mouth daily., Disp: 90 tablet, Rfl: 1  Allergies  Allergen Reactions  . Gabapentin     Tic disorder  . Metformin And Related     Not sure  . Amoxicillin-Pot Clavulanate Nausea And Vomiting    I personally reviewed active problem list, medication list, allergies, family history, social history, health maintenance with the patient/caregiver today.   ROS  Ten systems reviewed and is negative except as mentioned in HPI   Objective  Virtual encounter, vitals not obtained.  Body mass index is 51.98 kg/m.  Physical Exam  Awake, alert and oriented  PHQ2/9: Depression screen [facility] 2/9 09/08/2020 06/25/2020 03/23/2020 08/14/2019 04/15/2019  Decreased Interest 3 3 0 0 0  Down, Depressed, Hopeless 3 3 0 0 0  PHQ - 2 Score 6 6 0 0 0  Altered sleeping 3 0 0 0 0  Tired, decreased  energy 3 2 0 0 0  Change in appetite 0 2 0 0 0  Feeling bad or failure about yourself  3 - 0 0 0  Trouble concentrating 0 0 0 0 0  Moving slowly or fidgety/restless 0 0 0 0 0  Suicidal thoughts 0 0 0 0 0  PHQ-9 Score 15 10 0 0 0  Difficult doing work/chores Very difficult - Not difficult at all Not difficult at all Not difficult at all  Some recent data might be hidden   PHQ-2/9 Result is positive   Fall Risk: Fall Risk  06/25/2020 03/23/2020 08/14/2019 04/15/2019 01/11/2019  Falls in the past year? 0 1 0  0 0  Number falls in past yr: 0 0 0 0 0  Injury with Fall? 0 0 0 0 0  Comment - - - - -  Follow up - - - - -     Assessment & Plan  1. Cough  - DG Chest 2 View; Future  2. SOB (shortness of breath)  - DG Chest 2 View; Future  3. DDD (degenerative disc disease), lumbosacral  We will contact Dr. Jeneen Rinks, explained that she cannot take herself out of work prior to seeing a physician. Also explained that it does not seem that was a change on function since she went back to work in December. Not sure how we will fill out FMLA but I will do what Dr. Jeneen Rinks recommends  I discussed the assessment and treatment plan with the patient. The patient was provided an opportunity to ask questions and all were answered. The patient agreed with the plan and demonstrated an understanding of the instructions.  The patient was advised to call back or seek an in-person evaluation if the symptoms worsen or if the condition fails to improve as anticipated.  I provided 25 minutes of non-face-to-face time during this encounter.

## 2020-09-08 ENCOUNTER — Encounter: Payer: Self-pay | Admitting: Family Medicine

## 2020-09-08 ENCOUNTER — Telehealth (INDEPENDENT_AMBULATORY_CARE_PROVIDER_SITE_OTHER): Payer: BC Managed Care – PPO | Admitting: Family Medicine

## 2020-09-08 VITALS — Ht 69.0 in | Wt 352.0 lb

## 2020-09-08 DIAGNOSIS — R0602 Shortness of breath: Secondary | ICD-10-CM

## 2020-09-08 DIAGNOSIS — R059 Cough, unspecified: Secondary | ICD-10-CM

## 2020-09-08 DIAGNOSIS — M5137 Other intervertebral disc degeneration, lumbosacral region: Secondary | ICD-10-CM

## 2020-09-09 ENCOUNTER — Ambulatory Visit
Admission: RE | Admit: 2020-09-09 | Discharge: 2020-09-09 | Disposition: A | Discharge status: Home / Self Care | Payer: BC Managed Care – PPO | Source: Ambulatory Visit | Attending: Family Medicine | Admitting: Family Medicine

## 2020-09-09 ENCOUNTER — Other Ambulatory Visit: Payer: Self-pay

## 2020-09-09 DIAGNOSIS — R059 Cough, unspecified: Secondary | ICD-10-CM

## 2020-09-09 DIAGNOSIS — R0602 Shortness of breath: Secondary | ICD-10-CM | POA: Diagnosis present

## 2020-09-10 ENCOUNTER — Telehealth: Payer: Self-pay

## 2020-09-10 NOTE — Telephone Encounter (Signed)
Pt has called back, she has had her vaccine shots/ earlier last spring. Question b/c  Of her MS, MS Dr at [facility] suggested the antibodies (Evusheld) as precaution. What are Dr Ancil Boozer feelings ? Also chest xrays yesterday showed issues and she is checking on them also. [phone]

## 2020-09-10 NOTE — Telephone Encounter (Signed)
Copied from [place] [id number]. Topic: General - Call Back - No Documentation >> Sep 10, 2020 10:41 AM Erick Blinks wrote: Reason for CRM: Pt is scheduled for a vaccine appt tomorrow. Evusheld (antibiotic for covid) she wants to know if PCP/nurse would recommend having this or not. Please advise  Best contact: [phone]

## 2020-09-11 NOTE — Telephone Encounter (Signed)
Called pt and went over it with her

## 2020-09-15 ENCOUNTER — Other Ambulatory Visit: Payer: Self-pay | Admitting: Family Medicine

## 2020-09-15 DIAGNOSIS — R9389 Abnormal findings on diagnostic imaging of other specified body structures: Secondary | ICD-10-CM

## 2020-09-25 ENCOUNTER — Telehealth: Payer: Self-pay | Admitting: Family Medicine

## 2020-09-25 NOTE — Telephone Encounter (Signed)
LVM on the nurse line for Dr. Josephina Gip, MD with [facility] to call Dr. Ancil Boozer in regards to patient.

## 2020-09-29 ENCOUNTER — Ambulatory Visit: Payer: BC Managed Care – PPO | Admitting: Family Medicine

## 2020-09-29 ENCOUNTER — Telehealth: Payer: Self-pay

## 2020-09-29 ENCOUNTER — Telehealth: Payer: Self-pay | Admitting: Family Medicine

## 2020-09-30 NOTE — Telephone Encounter (Signed)
Ellouise Newer with pain clinic at [facility] would like dr Ancil Boozer to return her call concerning this patient

## 2020-10-06 ENCOUNTER — Telehealth: Payer: Self-pay

## 2020-10-06 ENCOUNTER — Encounter: Payer: Self-pay | Admitting: Family Medicine

## 2020-10-06 NOTE — Telephone Encounter (Signed)
Patient notified

## 2020-10-06 NOTE — Telephone Encounter (Signed)
Copied from [place] [id number]. Topic: General - Other >> Oct 06, 2020  2:22 PM Lennox Solders wrote: Reason for CRM: Pt is calling checking on the FMLA paperwork she sent through [facility]

## 2020-10-07 NOTE — Progress Notes (Unsigned)
Name: Morgan Craig   MRN: [id number]    DOB: 1961-08-07   Date:10/08/2020       Progress Note  Subjective  Chief Complaint  FMLA Form completion - she came in today using a walker, brought by her sister Morgan Craig   HPI  Chronic back pain: she is under the care of pain clinic at [facility]. She has back pain for the past 2 years from DDD lumbar spine. She states pain is getting progressive worse, she had ablation back in Nov and returned to work in Dec.  She needs to seat down most of the day, she took a leave of absence Nov 1st 2021 until Nov 29 th, 2021. She had nerve ablation in Nov and she went back to work in December, but states pain didn't improve much  She stopped going to work on January 17 th, 2022, she states she stopped going to work because of an URI possible COVID-19. She never went back due to increase in pain.She states that since January she is not driving, her sister has to do her grocery shopping, she had to change her shower from bathtub to a shower with a chair since unable to stand long for a shower, she is able to cook but with multiple breaks, she has not been able to clean her house, sister Morgan Craig helps her with laundry. She used to be able to use a cane but now has to use a walker. She went to [facility] yesterday to see pain clinic and psychologist. Based on notes she is not a good candidate to chronic opioid therapy. She is here today with FMLA forms. Explained how FMLA works . She will try aqua therapy as recommended by pain center, she will keep follow up with psychologist for CBT. 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 L1-2 demonstrate significant osteoarthritis of the left facet joint. Mild progression of the osteoarthritis of the L2 and L3 facet joints are noted.   L2-3 and L3-4 demonstrate moderate degree of central spinal canal stenosis. Central spinal canal stenosis shows  mild progression at L2-3 since preceding exam.   The degrees of neural foramen narrowing are noted in the lumbar spine some of which are progressive since preceding exam as discussed above  MS: under the care of [facility], had MRI from 2021 showed stability of plaques. She has numbness from waist down on both legs.  Patient Active Problem List   Diagnosis Date Noted  . Chronic pulmonary embolism ([place]) 07/24/2018  . History of spinal fusion 05/21/2018  . Essential hypertension 01/22/2018  . Morbid obesity with body mass index of 50.0-59.9 in adult ([place]) 01/22/2018  . Pre-diabetes 01/22/2018  . Intramural leiomyoma of uterus 11/30/2017  . Iron deficiency anemia due to chronic blood loss 09/25/2017  . Low protein C activity level ([place]) 09/25/2017  . Multiple sclerosis ([place]) 04/15/2017  . Vitamin D deficiency 04/15/2017  . Low back pain radiating to both legs 07/28/2015  . History of colonic polyps 02/12/2014  . Personal history of other venous thrombosis and embolism 01/03/2013  . History of deep venous thrombosis 01/03/2013  . Bilateral leg weakness 11/20/2012  . Hyperlipidemia with target LDL less than 100 10/27/2011  . Presbyopia 05/10/2011  . Microalbuminuria 04/22/2011  . Obstructive sleep apnea 01/15/2011    Past Surgical History:  Procedure Laterality Date  . COLONOSCOPY  2017   Found 1 polyp-[place]  Family History  Problem Relation Age of Onset  . Heart attack Mother   . Ulcers Mother        Legs  . Hypertension Mother   . COPD Father        Tobacco user  . Diabetes Mellitus II Father        Later on in life  . Cataracts Father   . Stroke Sister   . Anxiety disorder Sister   . Drug abuse Brother   . Alcohol abuse Brother   . Hypertension Sister   . Anxiety disorder Sister     Social History   Tobacco Use  . Smoking status: Never Smoker  . Smokeless tobacco: Never Used  Substance Use Topics  . Alcohol use: Yes    Comment: occasionally     Current  Outpatient Medications:  .  acetaminophen (TYLENOL) 650 MG CR tablet, Take 650 mg by mouth every 8 (eight) hours as needed for pain., Disp: , Rfl:  .  apixaban (ELIQUIS) 5 MG TABS tablet, Take 1 tablet (5 mg total) by mouth 2 (two) times daily., Disp: 180 tablet, Rfl: 1 .  DULoxetine (CYMBALTA) 30 MG capsule, Take 1 capsule by mouth once daily for 1 week, then increase to 2 capsules once daily, Disp: 60 capsule, Rfl: 2 .  ergocalciferol (VITAMIN D2) 1.25 MG (50000 UT) capsule, Take by mouth., Disp: , Rfl:  .  leuprolide (LUPRON) 11.25 MG KIT injection, Inject into the muscle., Disp: , Rfl:  .  losartan (COZAAR) 50 MG tablet, Take 1 tablet (50 mg total) by mouth daily., Disp: 90 tablet, Rfl: 1 .  pregabalin (LYRICA) 100 MG capsule, Take by mouth., Disp: , Rfl:  .  Vitamin D, Ergocalciferol, (DRISDOL) 1.25 MG (50000 UNIT) CAPS capsule, Take 50,000 Units by mouth once a week., Disp: , Rfl:   Allergies  Allergen Reactions  . Gabapentin     Tic disorder  . Metformin And Related     Not sure  . Amoxicillin-Pot Clavulanate Nausea And Vomiting    I personally reviewed active problem list, medication list, allergies, family history, social history, health maintenance with the patient/caregiver today.   ROS  Constitutional: Negative for fever , positive for weight change - lost weight .  Respiratory: Negative for cough and shortness of breath.   Cardiovascular: Negative for chest pain or palpitations.  Gastrointestinal: Negative for abdominal pain, no bowel changes.  Musculoskeletal: positive  for gait problem but no  joint swelling.  Skin: Negative for rash.  Neurological: Negative for dizziness or headache.  No other specific complaints in a complete review of systems (except as listed in HPI above).   Objective  Vitals:   10/08/20 1041  BP: 118/78  Pulse: 100  Resp: 18  Temp: 97.9 F (36.6 C)  TempSrc: Oral  SpO2: 95%  Weight: (!) 336 lb 8 oz (152.6 kg)  Height: '5\' 9"'  (1.753 m)     Body mass index is 49.69 kg/m.  Physical Exam  Constitutional: Patient appears well-developed and well-nourished. Obese  No distress.  HEENT: head atraumatic, normocephalic, pupils equal and reactive to light, neck supple Cardiovascular: Normal rate, regular rhythm and normal heart sounds.  No murmur heard. No BLE edema. Pulmonary/Chest: Effort normal and breath sounds normal. No respiratory distress. Abdominal: Soft.  There is no tenderness. Muscular skeletal: using a walker, negative straight leg raise, pain during palpation of lumbar spine  Psychiatric: Patient has a normal mood and affect. behavior is normal. Judgment and thought content  normal.  PHQ2/9: Depression screen [facility] 2/9 10/08/2020 09/08/2020 06/25/2020 03/23/2020 08/14/2019  Decreased Interest '3 3 3 ' 0 0  Down, Depressed, Hopeless 0 3 3 0 0  PHQ - 2 Score '3 6 6 ' 0 0  Altered sleeping 0 3 0 0 0  Tired, decreased energy '3 3 2 ' 0 0  Change in appetite 1 0 2 0 0  Feeling bad or failure about yourself  0 3 - 0 0  Trouble concentrating 0 0 0 0 0  Moving slowly or fidgety/restless 0 0 0 0 0  Suicidal thoughts 0 0 0 0 0  PHQ-9 Score '7 15 10 ' 0 0  Difficult doing work/chores Extremely dIfficult Very difficult - Not difficult at all Not difficult at all  Some recent data might be hidden    phq 9 is positive   Fall Risk: Fall Risk  10/08/2020 06/25/2020 03/23/2020 08/14/2019 04/15/2019  Falls in the past year? 1 0 1 0 0  Number falls in past yr: 0 0 0 0 0  Injury with Fall? 0 0 0 0 0  Comment - - - - -  Risk for fall due to : Impaired balance/gait;History of fall(s) - - - -  Follow up - - - - -     Functional Status Survey: Is the patient deaf or have difficulty hearing?: No Does the patient have difficulty seeing, even when wearing glasses/contacts?: No Does the patient have difficulty concentrating, remembering, or making decisions?: No Does the patient have difficulty walking or climbing stairs?: Yes Does the patient  have difficulty dressing or bathing?: Yes Does the patient have difficulty doing errands alone such as visiting a doctor's office or shopping?: Yes    Assessment & Plan  1. Chronic pain syndrome  We will fill out FMLA for her to take time off and start aqua therapy and CBT, explained it would be a good option if [facility] can allow her to work from home. She will discuss it with HR   2. DDD (degenerative disc disease), lumbosacral  Going to [facility] pain clinic , pain is worse, affecting ability to perform ADL  3. Morbid obesity ([place])  Losing weight, trying to decrease calorie intake   4. Multiple sclerosis ([place])  Stable, sees Neurology department at [facility]

## 2020-10-08 ENCOUNTER — Ambulatory Visit: Payer: BC Managed Care – PPO | Admitting: Family Medicine

## 2020-10-08 ENCOUNTER — Other Ambulatory Visit: Payer: Self-pay

## 2020-10-08 ENCOUNTER — Encounter: Payer: Self-pay | Admitting: Family Medicine

## 2020-10-08 VITALS — BP 118/78 | HR 100 | Temp 97.9°F | Resp 18 | Ht 69.0 in | Wt 336.5 lb

## 2020-10-08 DIAGNOSIS — G894 Chronic pain syndrome: Secondary | ICD-10-CM

## 2020-10-08 DIAGNOSIS — M5137 Other intervertebral disc degeneration, lumbosacral region: Secondary | ICD-10-CM | POA: Diagnosis not present

## 2020-10-08 DIAGNOSIS — G35 Multiple sclerosis: Secondary | ICD-10-CM

## 2020-11-03 ENCOUNTER — Other Ambulatory Visit: Payer: Self-pay | Admitting: Family Medicine

## 2020-11-03 DIAGNOSIS — M5137 Other intervertebral disc degeneration, lumbosacral region: Secondary | ICD-10-CM

## 2020-11-03 DIAGNOSIS — F341 Dysthymic disorder: Secondary | ICD-10-CM

## 2020-11-03 DIAGNOSIS — G894 Chronic pain syndrome: Secondary | ICD-10-CM

## 2020-11-03 NOTE — Telephone Encounter (Signed)
Requested medications are due for refill today yes  Requested medications are on the active medication list yes  Last refill 10/02/20  Last visit Do not see visit that addresses this med/dx  Future visit scheduled no  Notes to clinic Has already had a curtesy refill and there is no upcoming appointment scheduled.

## 2020-11-06 ENCOUNTER — Encounter: Payer: Self-pay | Admitting: Family Medicine

## 2020-11-09 ENCOUNTER — Telehealth: Payer: Self-pay

## 2020-11-09 ENCOUNTER — Other Ambulatory Visit: Payer: Self-pay

## 2020-11-09 DIAGNOSIS — G894 Chronic pain syndrome: Secondary | ICD-10-CM

## 2020-11-09 NOTE — Telephone Encounter (Signed)
Disability forms completed.Patient contacted/ MyChart message sent to let patient know they are ready for pick up.

## 2020-12-31 ENCOUNTER — Telehealth: Payer: Self-pay | Admitting: Family Medicine

## 2020-12-31 DIAGNOSIS — G894 Chronic pain syndrome: Secondary | ICD-10-CM

## 2020-12-31 DIAGNOSIS — M5137 Other intervertebral disc degeneration, lumbosacral region: Secondary | ICD-10-CM

## 2020-12-31 DIAGNOSIS — I10 Essential (primary) hypertension: Secondary | ICD-10-CM

## 2020-12-31 DIAGNOSIS — F341 Dysthymic disorder: Secondary | ICD-10-CM

## 2021-01-01 MED ORDER — LOSARTAN POTASSIUM 50 MG PO TABS: 50.0000 mg | ORAL_TABLET | Freq: Every day | ORAL | 1 refills | Status: DC

## 2021-01-01 NOTE — Telephone Encounter (Signed)
lvm for pt to call and schedule an appt °

## 2021-01-01 NOTE — Telephone Encounter (Signed)
Pt returned call and schedule for next available slot  in July

## 2021-01-07 ENCOUNTER — Telehealth: Payer: Self-pay

## 2021-01-07 NOTE — Telephone Encounter (Signed)
Patient is on Short term disability and needs a 6 form filled out monthly in order to get paid, pt states a copy is already in office and requesting to know if it can be filled out again and faxed to the number listed on form. Please advise

## 2021-01-12 ENCOUNTER — Encounter: Payer: Self-pay | Admitting: Family Medicine

## 2021-01-26 NOTE — Progress Notes (Signed)
Name: Morgan Craig   MRN: [id number]    DOB: April 14, 1961   Date:01/27/2021       Progress Note  Subjective  Chief Complaint  Follow up/Disability paperwork  HPI  Chronic back pain: she is under the care of pain clinic at [facility]. She has back pain for the past 2 years from DDD lumbar spine. She states pain is getting progressive worse, she had ablation back in Nov and returned to work in Dec.  She needs to seat down most of the day, she took a leave of absence Nov 1st 2021 until Nov 29 th, 2021. She had nerve ablation in Nov and she went back to work in December, but states pain didn't improve much  She stopped going to work on January 17 th, 2022, she states she stopped going to work because of an URI possible COVID-19. She never went back due to increase in pain.She states that since January she is not driving, her sister has to do her grocery shopping, she had to change her shower from bathtub to a shower with a chair since unable to stand long for a shower, she is able to cook but with multiple breaks, she has not been able to clean her house, sister Morgan Craig helps her with laundry. She used to be able to use a cane but now only able to walk inside the house without assistance, and when out of the house needs to use a walker  She went to [facility] Feb 2022 to see pain clinic and psychologist. Based on notes she is not a good candidate to chronic opioid therapy, she was advised to have Aqua therapy, advised Lyrica and Topamax but she refused medications and has not been back to pain clinic and cancelled appointment with therapist because she states pain medication is what would help her , since therapist did not approve her for opioids she decided not to go back. Explained that they were going to help her by doing CBT and help her lose weight . Explained to her that she is considered a non compliant with medical regiment  and she must do something to help her get better. Today she told me she tried lyrica in  the past and think it caused her to swell up . Also explained that disability insurances need to make sure she is following medical recommendation.    IMPRESSION/Dec 2022  L2-3 and L3-4 demonstrate significant osteoarthritis of the facet joints. L1-2 demonstrate significant osteoarthritis of the left facet joint. Mild progression of the osteoarthritis of the L2 and L3 facet joints are noted.   L2-3 and L3-4 demonstrate moderate degree of central spinal canal stenosis. Central spinal canal stenosis shows mild progression at L2-3 since preceding exam.   The degrees of neural foramen narrowing are noted in the lumbar spine some of which are progressive since preceding exam as discussed above  MS: under the care of [facility], had MRI from 2021 showed stability of plaques. She has numbness from waist down on both legs.She had infusion recently  She has not been seen for her regular follow up since Nov, explained importance of returning to discuss other medical problems since we spent the entire visit discussed disability papers and filling out forms.   Patient Active Problem List   Diagnosis Date Noted   Chronic pulmonary embolism ([place]) 07/24/2018   History of spinal fusion 05/21/2018   Essential hypertension 01/22/2018   Morbid obesity with body mass index of 50.0-59.9 in adult [facility]) 01/22/2018  Pre-diabetes 01/22/2018   Intramural leiomyoma of uterus 11/30/2017   Iron deficiency anemia due to chronic blood loss 09/25/2017   Low protein C activity level ([place]) 09/25/2017   Multiple sclerosis ([place]) 04/15/2017   Vitamin D deficiency 04/15/2017   Low back pain radiating to both legs 07/28/2015   History of colonic polyps 02/12/2014   Personal history of other venous thrombosis and embolism 01/03/2013   History of deep venous thrombosis 01/03/2013   Bilateral leg weakness 11/20/2012   Hyperlipidemia with target LDL less than 100 10/27/2011   Presbyopia 05/10/2011   Microalbuminuria 04/22/2011    Obstructive sleep apnea 01/15/2011    Past Surgical History:  Procedure Laterality Date   COLONOSCOPY  2017   Found 1 polyp-[place]    Family History  Problem Relation Age of Onset   Heart attack Mother    Ulcers Mother        Legs   Hypertension Mother    COPD Father        Tobacco user   Diabetes Mellitus II Father        Later on in life   Cataracts Father    Stroke Sister    Anxiety disorder Sister    Drug abuse Brother    Alcohol abuse Brother    Hypertension Sister    Anxiety disorder Sister     Social History   Tobacco Use   Smoking status: Never   Smokeless tobacco: Never  Substance Use Topics   Alcohol use: Yes    Comment: occasionally     Current Outpatient Medications:    acetaminophen (TYLENOL) 325 MG tablet, Take by mouth., Disp: , Rfl:    acetaminophen (TYLENOL) 650 MG CR tablet, Take 650 mg by mouth every 8 (eight) hours as needed for pain., Disp: , Rfl:    diphenhydrAMINE (BENADRYL) 25 mg capsule, Take by mouth., Disp: , Rfl:    ergocalciferol (VITAMIN D2) 1.25 MG (50000 UT) capsule, Take by mouth., Disp: , Rfl:    leuprolide (LUPRON) 11.25 MG KIT injection, Inject into the muscle., Disp: , Rfl:    Vitamin D, Ergocalciferol, (DRISDOL) 1.25 MG (50000 UNIT) CAPS capsule, Take 50,000 Units by mouth once a week., Disp: , Rfl:    apixaban (ELIQUIS) 5 MG TABS tablet, Take 1 tablet (5 mg total) by mouth 2 (two) times daily., Disp: 60 tablet, Rfl: 0   DULoxetine (CYMBALTA) 60 MG capsule, Take 1 capsule (60 mg total) by mouth daily., Disp: 30 capsule, Rfl: 0   losartan (COZAAR) 50 MG tablet, Take 1 tablet (50 mg total) by mouth daily., Disp: 30 tablet, Rfl: 0  Allergies  Allergen Reactions   Gabapentin     Tic disorder   Lyrica [Pregabalin]     Swelling and leg heaviness that causes gait difficulties    Metformin And Related     Not sure   Amoxicillin-Pot Clavulanate Nausea And Vomiting    I personally reviewed active problem list, medication  list, allergies, family history, social history with the patient/caregiver today.   ROS  Ten systems reviewed and is negative except as mentioned in HPI   Objective  Vitals:   01/27/21 1354  BP: 132/82  Pulse: 92  Resp: 16  Temp: 98 F (36.7 C)  TempSrc: Oral  SpO2: 98%  Weight: (!) 338 lb (153.3 kg)  Height: '5\' 9"'  (1.753 m)    Body mass index is 49.91 kg/m.  Physical Exam  Constitutional: Patient appears well-developed and well-nourished. Obese  No  distress.  HEENT: head atraumatic, normocephalic, pupils equal and reactive to light, neck supple Cardiovascular: Normal rate, regular rhythm and normal heart sounds.  No murmur heard. Trace BLE edema. Pulmonary/Chest: Effort normal and breath sounds normal. No respiratory distress. Abdominal: Soft.  There is no tenderness. Muscular Skeletal: using walker, no pain during palpation of spine, difficulty raising her legs but likely multifactorial. Normal strength and rom of upper extremities  Psychiatric: Patient has a normal mood and affect. behavior is normal. Judgment and thought content normal.   PHQ2/9: Depression screen [facility] 2/9 01/27/2021 10/08/2020 09/08/2020 06/25/2020 03/23/2020  Decreased Interest 0 '3 3 3 ' 0  Down, Depressed, Hopeless 3 0 3 3 0  PHQ - 2 Score '3 3 6 6 ' 0  Altered sleeping 0 0 3 0 0  Tired, decreased energy '3 3 3 2 ' 0  Change in appetite 0 1 0 2 0  Feeling bad or failure about yourself  0 0 3 - 0  Trouble concentrating 0 0 0 0 0  Moving slowly or fidgety/restless 0 0 0 0 0  Suicidal thoughts 0 0 0 0 0  PHQ-9 Score '6 7 15 10 ' 0  Difficult doing work/chores - Extremely dIfficult Very difficult - Not difficult at all  Some recent data might be hidden    phq 9 is positive   Fall Risk: Fall Risk  01/27/2021 10/08/2020 06/25/2020 03/23/2020 08/14/2019  Falls in the past year? 1 1 0 1 0  Number falls in past yr: 0 0 0 0 0  Injury with Fall? 0 0 0 0 0  Comment - - - - -  Risk for fall due to : - Impaired  balance/gait;History of fall(s) - - -  Follow up - - - - -      Assessment & Plan  1. 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  Dispense: 60 tablet; Refill: 0  2. Essential hypertension  - losartan (COZAAR) 50 MG tablet; Take 1 tablet (50 mg total) by mouth daily.  Dispense: 30 tablet; Refill: 0  3. DDD (degenerative disc disease), lumbosacral  - DULoxetine (CYMBALTA) 60 MG capsule; Take 1 capsule (60 mg total) by mouth daily.  Dispense: 30 capsule; Refill: 0  4. Dysthymia  - DULoxetine (CYMBALTA) 60 MG capsule; Take 1 capsule (60 mg total) by mouth daily.  Dispense: 30 capsule; Refill: 0  5. Chronic pain syndrome  - DULoxetine (CYMBALTA) 60 MG capsule; Take 1 capsule (60 mg total) by mouth daily.  Dispense: 30 capsule; Refill: 0   Sending 30 days supply of regular medications until she returns for regular follow up

## 2021-01-27 ENCOUNTER — Other Ambulatory Visit: Payer: Self-pay

## 2021-01-27 ENCOUNTER — Ambulatory Visit: Payer: BC Managed Care – PPO | Admitting: Family Medicine

## 2021-01-27 ENCOUNTER — Encounter: Payer: Self-pay | Admitting: Family Medicine

## 2021-01-27 DIAGNOSIS — G894 Chronic pain syndrome: Secondary | ICD-10-CM

## 2021-01-27 DIAGNOSIS — I2782 Chronic pulmonary embolism: Secondary | ICD-10-CM

## 2021-01-27 DIAGNOSIS — I2692 Saddle embolus of pulmonary artery without acute cor pulmonale: Secondary | ICD-10-CM

## 2021-01-27 DIAGNOSIS — M5137 Other intervertebral disc degeneration, lumbosacral region: Secondary | ICD-10-CM

## 2021-01-27 DIAGNOSIS — I10 Essential (primary) hypertension: Secondary | ICD-10-CM

## 2021-01-27 DIAGNOSIS — F341 Dysthymic disorder: Secondary | ICD-10-CM | POA: Diagnosis not present

## 2021-01-27 DIAGNOSIS — I2602 Saddle embolus of pulmonary artery with acute cor pulmonale: Secondary | ICD-10-CM

## 2021-01-27 MED ORDER — LOSARTAN POTASSIUM 50 MG PO TABS: 50.0000 mg | ORAL_TABLET | Freq: Every day | ORAL | 0 refills | Status: DC

## 2021-01-27 MED ORDER — DULOXETINE HCL 60 MG PO CPEP: 60.0000 mg | ORAL_CAPSULE | Freq: Every day | ORAL | 0 refills | Status: DC

## 2021-01-27 MED ORDER — ELIQUIS 5 MG PO TABS
5.0000 mg | ORAL_TABLET | Freq: Two times a day (BID) | ORAL | 0 refills | Status: DC
Start: 2021-01-27 — End: 2021-03-03

## 2021-01-29 ENCOUNTER — Telehealth: Payer: Self-pay | Admitting: Family Medicine

## 2021-01-29 NOTE — Telephone Encounter (Signed)
completed

## 2021-01-29 NOTE — Telephone Encounter (Signed)
Pt is calling and the short term disability form 703 that was completed on 01-12-2021. The form needs to be refax to [place] [phone]. Please fax asap

## 2021-03-02 NOTE — Progress Notes (Signed)
Name: Morgan Craig   MRN: [id number]    DOB: 05/04/1961   Date:03/03/2021       Progress Note  Subjective  Chief Complaint  Follow Up  HPI  MS: under the care of [facility], had MRI from 2021 showed stability of plaques. She has numbness from waist down on both legs.She has a Ocrevus infusion every 6 months, last one was 01/12/2021. She denies side effects of medication   Chronic  PE:  It happened after neck fusion back in  05/21/2018  Seen by hematologist and was told to be on coumadin for  6 months, however after that she was seen by pulmonologist Dr. Fredrich Birks and was told to stay on anticoagulation for the rest of her life. She has protein C defienecy   She has difficulty taking time off work. Last VQ scan was done earlier this year at [facility] with poor perfusion on right lower lung field 10/2018 . She is on Eliquis, she denies bleeding, but has easy bruising    HTN: she is taking losartan, she also has a history of microalbuminuria. She has not been bp at home, denies chest pain, palpitation but has intermittent SOB   Hyperlipidemia: risk of heart attacks and strokes has decreased, not on statin therapy    The 10-year ASCVD risk score Mikey Bussing DC Jr., et al., 2013) is: 6.9%*   Values used to calculate the score:     Age: 60 years     Sex: Female     Is Non-Hispanic African American: Yes     Diabetic: No     Tobacco smoker: No     Systolic Blood Pressure: 283 mmHg     Is BP treated: Yes     HDL Cholesterol: 41 mg/dL*     Total Cholesterol: 182 mg/dL*     * - Cholesterol units were assumed for this score calculation   Morbid obesity: BMI above 50, she has been unable to be physically active, and also states eating more chocolate. Explained weight loss will help her ambulate better    Dysthymia: she is doing better, she states duloxetine helps with her mood, even though does not seem to help with her pain. No crying spells lately. She has lack of energy - likely multifactorial. She was smiling today    History of menorrhagia and also iron deficiency anemia: she has been on Lupron and usually no cycles, but had a recent break through bleeding, it was spotting once last week. Last HCT normal in May - done at [facility]   Patient Active Problem List   Diagnosis Date Noted   Chronic pulmonary embolism ([place]) 07/24/2018   History of spinal fusion 05/21/2018   Essential hypertension 01/22/2018   Morbid obesity with body mass index of 50.0-59.9 in adult [facility]) 01/22/2018   Pre-diabetes 01/22/2018   Intramural leiomyoma of uterus 11/30/2017   Iron deficiency anemia due to chronic blood loss 09/25/2017   Low protein C activity level ([place]) 09/25/2017   Multiple sclerosis ([place]) 04/15/2017   Vitamin D deficiency 04/15/2017   Low back pain radiating to both legs 07/28/2015   History of colonic polyps 02/12/2014   Personal history of other venous thrombosis and embolism 01/03/2013   History of deep venous thrombosis 01/03/2013   Bilateral leg weakness 11/20/2012   Hyperlipidemia with target LDL less than 100 10/27/2011   Presbyopia 05/10/2011   Microalbuminuria 04/22/2011   Obstructive sleep apnea 01/15/2011    Past Surgical History:  Procedure Laterality Date  COLONOSCOPY  2017   Found 1 polyp-[place]    Family History  Problem Relation Age of Onset   Heart attack Mother    Ulcers Mother        Legs   Hypertension Mother    COPD Father        Tobacco user   Diabetes Mellitus II Father        Later on in life   Cataracts Father    Stroke Sister    Anxiety disorder Sister    Drug abuse Brother    Alcohol abuse Brother    Hypertension Sister    Anxiety disorder Sister     Social History   Tobacco Use   Smoking status: Never   Smokeless tobacco: Never  Substance Use Topics   Alcohol use: Yes    Comment: occasionally     Current Outpatient Medications:    acetaminophen (TYLENOL) 325 MG tablet, Take by mouth., Disp: , Rfl:    acetaminophen (TYLENOL) 650 MG CR tablet,  Take 650 mg by mouth every 8 (eight) hours as needed for pain., Disp: , Rfl:    apixaban (ELIQUIS) 5 MG TABS tablet, Take 1 tablet (5 mg total) by mouth 2 (two) times daily., Disp: 60 tablet, Rfl: 0   diphenhydrAMINE (BENADRYL) 25 mg capsule, Take by mouth., Disp: , Rfl:    DULoxetine (CYMBALTA) 60 MG capsule, Take 1 capsule (60 mg total) by mouth daily., Disp: 30 capsule, Rfl: 0   ergocalciferol (VITAMIN D2) 1.25 MG (50000 UT) capsule, Take by mouth., Disp: , Rfl:    leuprolide (LUPRON) 11.25 MG KIT injection, Inject into the muscle., Disp: , Rfl:    losartan (COZAAR) 50 MG tablet, Take 1 tablet (50 mg total) by mouth daily., Disp: 30 tablet, Rfl: 0  Allergies  Allergen Reactions   Gabapentin     Tic disorder   Lyrica [Pregabalin]     Swelling and leg heaviness that causes gait difficulties    Metformin And Related     Not sure   Amoxicillin-Pot Clavulanate Nausea And Vomiting    I personally reviewed active problem list, medication list, allergies, family history, social history, health maintenance with the patient/caregiver today.   ROS  Constitutional: Negative for fever or weight change.  Respiratory: Negative for cough and shortness of breath.   Cardiovascular: Negative for chest pain or palpitations.  Gastrointestinal: Negative for abdominal pain, no bowel changes.  Musculoskeletal: Negative for gait problem or joint swelling.  Skin: Negative for rash.  Neurological: Negative for dizziness or headache.  No other specific complaints in a complete review of systems (except as listed in HPI above).   Objective  Vitals:   03/03/21 1316  BP: 130/82  Pulse: 94  Resp: 16  Temp: 97.8 F (36.6 C)  TempSrc: Oral  SpO2: 95%  Weight: (!) 342 lb (155.1 kg)  Height: _0  (1.753 m)    Body mass index is 50.5 kg/m.  Physical Exam  Constitutional: Patient appears well-developed and well-nourished. Obese  No distress.  HEENT: head atraumatic, normocephalic, pupils equal  and reactive to light, neck supple Cardiovascular: Normal rate, regular rhythm and normal heart sounds.  No murmur heard. No BLE edema. Pulmonary/Chest: Effort normal and breath sounds normal. No respiratory distress. Abdominal: Soft.  There is no tenderness. Muscular skeletal:  Psychiatric: Patient has a normal mood and affect. behavior is normal. Judgment and thought content normal.   PHQ2/9: Depression screen [place] 2/9 03/03/2021 01/27/2021 10/08/2020 09/08/2020 06/25/2020  Decreased  Interest 3 0 _0 Down, Depressed, Hopeless 0 3 0 3 3  PHQ - 2 Score _1 Altered sleeping 3 0 0 3 0  Tired, decreased energy _2 Change in appetite 0 0 1 0 2  Feeling bad or failure about yourself  0 0 0 3 -  Trouble concentrating 0 0 0 0 0  Moving slowly or fidgety/restless 0 0 0 0 0  Suicidal thoughts 0 0 0 0 0  PHQ-9 Score _3 Difficult doing work/chores - - Extremely dIfficult Very difficult -  Some recent data might be hidden    phq 9 is positive   Fall Risk: Fall Risk  03/03/2021 01/27/2021 10/08/2020 06/25/2020 03/23/2020  Falls in the past year? _4 0 1  Number falls in past yr: 0 0 0 0 0  Injury with Fall? 0 0 0 0 0  Comment - - - - -  Risk for fall due to : - - Impaired balance/gait;History of fall(s) - -  Follow up - - - - -     Functional Status Survey: Is the patient deaf or have difficulty hearing?: No Does the patient have difficulty seeing, even when wearing glasses/contacts?: No Does the patient have difficulty concentrating, remembering, or making decisions?: No Does the patient have difficulty walking or climbing stairs?: Yes Does the patient have difficulty dressing or bathing?: Yes Does the patient have difficulty doing errands alone such as visiting a doctor's office or shopping?: Yes    Assessment & Plan  1. Multiple sclerosis ([place])  Keep follow up at [facility]  2. Essential hypertension  - COMPLETE METABOLIC PANEL WITH GFR - losartan (COZAAR) 50  MG tablet; Take 1 tablet (50 mg total) by mouth daily.  Dispense: 90 tablet; Refill: 1  3. DDD (degenerative disc disease), lumbosacral  Still has daily pain , not currently working   4. Low protein C activity level (HCC)  On eliqiuis for life since had a saddle embolism after neck surgeyr   5. 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  Dispense: 180 tablet; Refill: 1  6. Morbid obesity ([place])  Discussed with the patient the risk posed by an increased BMI. Discussed importance of portion control, calorie counting and at least 150 minutes of physical activity weekly. Avoid sweet beverages and drink more water. Eat at least 6 servings of fruit and vegetables daily    7. History of deep venous thrombosis (DVT) of distal vein of left lower extremity   8. Chronic pain syndrome   9. Dysthymia  - DULoxetine (CYMBALTA) 30 MG capsule; Take 3 capsules (90 mg total) by mouth daily.  Dispense: 270 capsule; Refill: 1  10. Bilateral lower extremity edema   11. History of neck surgery   12. Vitamin D deficiency  - VITAMIN D 25 Hydroxy (Vit-D Deficiency, Fractures)  13. History of iron deficiency anemia   14. Microalbuminuria  - Urine Microalbumin w/creat. ratio  15. Dyslipidemia  - Lipid panel  16. Need for Tdap vaccination  - Tdap vaccine greater than or equal to 7yo IM

## 2021-03-03 ENCOUNTER — Encounter: Payer: Self-pay | Admitting: Family Medicine

## 2021-03-03 ENCOUNTER — Other Ambulatory Visit: Payer: Self-pay

## 2021-03-03 ENCOUNTER — Ambulatory Visit: Payer: BC Managed Care – PPO | Admitting: Family Medicine

## 2021-03-03 VITALS — BP 130/82 | HR 94 | Temp 97.8°F | Resp 16 | Ht 69.0 in | Wt 342.0 lb

## 2021-03-03 DIAGNOSIS — R600 Localized edema: Secondary | ICD-10-CM

## 2021-03-03 DIAGNOSIS — G35 Multiple sclerosis: Secondary | ICD-10-CM

## 2021-03-03 DIAGNOSIS — G35D Multiple sclerosis, unspecified: Secondary | ICD-10-CM

## 2021-03-03 DIAGNOSIS — R809 Proteinuria, unspecified: Secondary | ICD-10-CM

## 2021-03-03 DIAGNOSIS — D6859 Other primary thrombophilia: Secondary | ICD-10-CM | POA: Diagnosis not present

## 2021-03-03 DIAGNOSIS — E559 Vitamin D deficiency, unspecified: Secondary | ICD-10-CM

## 2021-03-03 DIAGNOSIS — I10 Essential (primary) hypertension: Secondary | ICD-10-CM | POA: Diagnosis not present

## 2021-03-03 DIAGNOSIS — M5137 Other intervertebral disc degeneration, lumbosacral region: Secondary | ICD-10-CM

## 2021-03-03 DIAGNOSIS — E785 Hyperlipidemia, unspecified: Secondary | ICD-10-CM

## 2021-03-03 DIAGNOSIS — Z862 Personal history of diseases of the blood and blood-forming organs and certain disorders involving the immune mechanism: Secondary | ICD-10-CM

## 2021-03-03 DIAGNOSIS — F341 Dysthymic disorder: Secondary | ICD-10-CM

## 2021-03-03 DIAGNOSIS — Z23 Encounter for immunization: Secondary | ICD-10-CM

## 2021-03-03 DIAGNOSIS — M51379 Other intervertebral disc degeneration, lumbosacral region without mention of lumbar back pain or lower extremity pain: Secondary | ICD-10-CM

## 2021-03-03 DIAGNOSIS — I2602 Saddle embolus of pulmonary artery with acute cor pulmonale: Secondary | ICD-10-CM

## 2021-03-03 DIAGNOSIS — I2782 Chronic pulmonary embolism: Secondary | ICD-10-CM

## 2021-03-03 DIAGNOSIS — G894 Chronic pain syndrome: Secondary | ICD-10-CM

## 2021-03-03 DIAGNOSIS — Z86718 Personal history of other venous thrombosis and embolism: Secondary | ICD-10-CM

## 2021-03-03 DIAGNOSIS — Z9889 Other specified postprocedural states: Secondary | ICD-10-CM

## 2021-03-03 DIAGNOSIS — I2692 Saddle embolus of pulmonary artery without acute cor pulmonale: Secondary | ICD-10-CM

## 2021-03-03 MED ORDER — LOSARTAN POTASSIUM 50 MG PO TABS: 50.0000 mg | ORAL_TABLET | Freq: Every day | ORAL | 1 refills | Status: DC

## 2021-03-03 MED ORDER — APIXABAN 5 MG PO TABS
5.0000 mg | ORAL_TABLET | Freq: Two times a day (BID) | ORAL | 1 refills | Status: DC
Start: 2021-03-03 — End: 2021-09-03

## 2021-03-03 MED ORDER — DULOXETINE HCL 30 MG PO CPEP: 90.0000 mg | ORAL_CAPSULE | Freq: Every day | ORAL | 1 refills | Status: DC

## 2021-03-04 LAB — COMPLETE METABOLIC PANEL WITH GFR
AG Ratio: 1.4 (calc) (ref 1.0–2.5)
ALT: 15 U/L (ref 6–29)
AST: 15 U/L (ref 10–35)
Albumin: 4 g/dL (ref 3.6–5.1)
Alkaline phosphatase (APISO): 124 U/L (ref 37–153)
BUN: 13 mg/dL (ref 7–25)
CO2: 32 mmol/L (ref 20–32)
Calcium: 9.4 mg/dL (ref 8.6–10.4)
Chloride: 106 mmol/L (ref 98–110)
Creat: 1 mg/dL (ref 0.50–1.03)
Globulin: 2.9 g/dL (calc) (ref 1.9–3.7)
Glucose, Bld: 92 mg/dL (ref 65–99)
Potassium: 4.4 mmol/L (ref 3.5–5.3)
Sodium: 143 mmol/L (ref 135–146)
Total Bilirubin: 0.5 mg/dL (ref 0.2–1.2)
Total Protein: 6.9 g/dL (ref 6.1–8.1)
eGFR: 66 mL/min/{1.73_m2} (ref >=60)

## 2021-03-04 LAB — VITAMIN D 25 HYDROXY (VIT D DEFICIENCY, FRACTURES): Vit D, 25-Hydroxy: 43 ng/mL (ref 30–100)

## 2021-03-04 LAB — LIPID PANEL
Cholesterol: 240 mg/dL — ABNORMAL HIGH (ref <200)
HDL: 48 mg/dL — ABNORMAL LOW (ref >=50)
LDL Cholesterol (Calc): 156 mg/dL (calc) — ABNORMAL HIGH
Non-HDL Cholesterol (Calc): 192 mg/dL (calc) — ABNORMAL HIGH (ref <130)
Total CHOL/HDL Ratio: 5 (calc) — ABNORMAL HIGH (ref <5.0)
Triglycerides: 203 mg/dL — ABNORMAL HIGH (ref <150)

## 2021-03-06 LAB — MICROALBUMIN / CREATININE URINE RATIO
Creatinine, Urine: 209 mg/dL (ref 20–275)
Microalb Creat Ratio: 137 mcg/mg creat — ABNORMAL HIGH (ref <30)
Microalb, Ur: 28.6 mg/dL

## 2021-04-07 NOTE — Telephone Encounter (Signed)
Please close chart

## 2021-05-07 ENCOUNTER — Encounter: Payer: Self-pay | Admitting: Family Medicine

## 2021-05-31 ENCOUNTER — Encounter: Payer: Self-pay | Admitting: Family Medicine

## 2021-06-22 NOTE — Telephone Encounter (Signed)
Awaiting Signature from [place]. Will update patient when faxed/completed.

## 2021-07-01 ENCOUNTER — Telehealth: Payer: Self-pay | Admitting: Family Medicine

## 2021-07-01 NOTE — Telephone Encounter (Signed)
Patient returning call back to nurse cassandra, she states she told her about a weight loss program, but she is saying she must have her confused with someone else, because she didn't ask about this.

## 2021-07-02 NOTE — Telephone Encounter (Signed)
Called patient and left a message to disregard previous message. Thanks.

## 2021-07-20 NOTE — Telephone Encounter (Signed)
Awaiting signature and will fax

## 2021-07-31 ENCOUNTER — Encounter: Payer: Self-pay | Admitting: Family Medicine

## 2021-08-02 ENCOUNTER — Telehealth: Payer: BC Managed Care – PPO | Admitting: Family Medicine

## 2021-08-02 DIAGNOSIS — R093 Abnormal sputum: Secondary | ICD-10-CM

## 2021-08-02 NOTE — Progress Notes (Signed)
[place]   Needs in person eval due to abnormal sputum production and fevers

## 2021-08-23 NOTE — Telephone Encounter (Signed)
Awaiting signature from Dr. Ancil Boozer. Will update patient when completed.

## 2021-09-03 ENCOUNTER — Ambulatory Visit: Payer: BC Managed Care – PPO | Admitting: Family Medicine

## 2021-09-03 ENCOUNTER — Encounter: Payer: Self-pay | Admitting: Family Medicine

## 2021-09-03 VITALS — BP 134/86 | HR 87 | Temp 97.7°F | Resp 16 | Ht 69.0 in | Wt 339.0 lb

## 2021-09-03 DIAGNOSIS — G894 Chronic pain syndrome: Secondary | ICD-10-CM

## 2021-09-03 DIAGNOSIS — G35 Multiple sclerosis: Secondary | ICD-10-CM | POA: Diagnosis not present

## 2021-09-03 DIAGNOSIS — E8881 Metabolic syndrome: Secondary | ICD-10-CM

## 2021-09-03 DIAGNOSIS — D6859 Other primary thrombophilia: Secondary | ICD-10-CM

## 2021-09-03 DIAGNOSIS — R600 Localized edema: Secondary | ICD-10-CM

## 2021-09-03 DIAGNOSIS — E278 Other specified disorders of adrenal gland: Secondary | ICD-10-CM | POA: Insufficient documentation

## 2021-09-03 DIAGNOSIS — I2692 Saddle embolus of pulmonary artery without acute cor pulmonale: Secondary | ICD-10-CM

## 2021-09-03 DIAGNOSIS — M51379 Other intervertebral disc degeneration, lumbosacral region without mention of lumbar back pain or lower extremity pain: Secondary | ICD-10-CM

## 2021-09-03 DIAGNOSIS — I10 Essential (primary) hypertension: Secondary | ICD-10-CM

## 2021-09-03 DIAGNOSIS — Z23 Encounter for immunization: Secondary | ICD-10-CM

## 2021-09-03 DIAGNOSIS — E88819 Insulin resistance, unspecified: Secondary | ICD-10-CM

## 2021-09-03 DIAGNOSIS — Z86718 Personal history of other venous thrombosis and embolism: Secondary | ICD-10-CM

## 2021-09-03 DIAGNOSIS — F341 Dysthymic disorder: Secondary | ICD-10-CM

## 2021-09-03 DIAGNOSIS — Z9889 Other specified postprocedural states: Secondary | ICD-10-CM

## 2021-09-03 DIAGNOSIS — E559 Vitamin D deficiency, unspecified: Secondary | ICD-10-CM

## 2021-09-03 DIAGNOSIS — R809 Proteinuria, unspecified: Secondary | ICD-10-CM

## 2021-09-03 DIAGNOSIS — E785 Hyperlipidemia, unspecified: Secondary | ICD-10-CM

## 2021-09-03 DIAGNOSIS — R5383 Other fatigue: Secondary | ICD-10-CM

## 2021-09-03 DIAGNOSIS — I2782 Chronic pulmonary embolism: Secondary | ICD-10-CM

## 2021-09-03 DIAGNOSIS — M5137 Other intervertebral disc degeneration, lumbosacral region: Secondary | ICD-10-CM

## 2021-09-03 MED ORDER — APIXABAN 5 MG PO TABS: 5.0000 mg | ORAL_TABLET | Freq: Two times a day (BID) | ORAL | 1 refills | Status: AC

## 2021-09-03 MED ORDER — DULOXETINE HCL 60 MG PO CPEP: 60.0000 mg | ORAL_CAPSULE | Freq: Every day | ORAL | 1 refills | Status: AC

## 2021-09-03 MED ORDER — ERGOCALCIFEROL 1.25 MG (50000 UT) PO CAPS: 50000.0000 [IU] | ORAL_CAPSULE | ORAL | 0 refills | Status: AC

## 2021-09-03 MED ORDER — LOSARTAN POTASSIUM 50 MG PO TABS: 50.0000 mg | ORAL_TABLET | Freq: Every day | ORAL | 1 refills | Status: AC

## 2021-09-03 NOTE — Progress Notes (Signed)
Name: Morgan Craig   MRN: [id number]    DOB: 06/14/1961   Date:09/03/2021       Progress Note  Subjective  Chief Complaint  Follow Up  HPI  MS: under the care of [facility], had MRI from 2022  showed stability of plaques - she also has a left adrenal mass, size is unchanged from 2020 - likely benign . She has numbness from waist down on both legs.She has a Ocrevus infusion every 6 months, last infusion Dec 2022. 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 Fredrich Birks and was told to stay on anticoagulation for the rest of her life. She has protein C deficiency that increases risk of clotting. Last VQ scan was done earlier this year at [facility] with poor perfusion on right lower lung field 10/2018 . She is on Eliquis, she denies bleeding, but has easy bruising . She also has OSA but cannot tolerate CPAP, discussed increase risk of pulmonary hypertension    HTN: she is taking losartan, she also has a history of microalbuminuria. We will recheck labs. She denies chest pain or palpitation, denies recent episodes of SOB   Hyperlipidemia: risk of heart attacks and strokes has decreased, not on statin therapy    The 10-year ASCVD risk score (Arnett DK, et al., 2019) is: 9%   Values used to calculate the score:     Age: 61 years     Sex: Female     Is Non-Hispanic African American: Yes     Diabetic: No     Tobacco smoker: No     Systolic Blood Pressure: 595 mmHg     Is BP treated: Yes     HDL Cholesterol: 48 mg/dL     Total Cholesterol: 240 mg/dL   Morbid obesity: BMI above 50, she has been unable to be physically active, she has OSA and is not wearing her CPAP. She feels hungry all the time.   Dysthymia: she is doing better, she states duloxetine helps with her mood, even though does not seem to help with her pain. No crying spells  lately. She has lack of energy - likely multifactorial. She feels like she can go down on Duloxetine from 90 mg to 60 mg today. She takes naps during the day and cannot sleep well at night. Discussed sleep hygiene  History of menorrhagia and also iron deficiency anemia: she was on Lupron for a while, recently GYN stopped medication a couple of months ago since she is 8 . Last HCT was 44.2 , she is not taking iron at this time   Chronic back pain: she is under the care of pain clinic at [facility]. 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 She pain got progressively worse. She  took a leave of absence Nov 1st 2021 until Nov 29 th, 2021. She had nerve ablation in Nov and she went back to work in December, but states pain didn't improve much . She stopped going to work again  on January 17 th, 2022, she states she stopped going to work because of an URI possible COVID-19. She never went back due to increase in pain.She states that since January 22  she is not driving, her sister has to do her grocery shopping,  she had to change her shower from bathtub to a shower with a chair since unable to stand long for a shower, she is able to cook but with multiple breaks, she has not been able to clean her house, sister [place] helps her with laundry. She used to be able to use a cane but now only able to walk inside the house without assistance device, and when out of the house needs to use a walker  She went to [facility] Feb 2022 to see pain clinic and psychologist. Based on notes she is not a good candidate to chronic opioid therapy, she was advised to have Aqua therapy, advised Lyrica and Topamax but she refused medications and has not been back to pain clinic and cancelled appointment with therapist because she states pain medication is what would help her , since therapist did not approve her for opioids she decided not to go back. Explained that they were going to help her by doing CBT and help her  lose weight. Explained to her that she is considered a non compliant with medical regiment  and she must do something to help her get better. Today she told me she tried lyrica in the past and think it caused her to swell up . Also explained that disability insurances need to make sure she is following medical medication. Discussed consider chiropractor . She had PT in the past and suggested her to go back but she states she cannot afford it at this time. She states symptoms are worse when walking or standing, better when sitting. She I asked about a remote position and she said [employer] did not offer that and she would have to re-apply without guarantees that she would get the position. 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 L1-2 demonstrate significant osteoarthritis of the left facet joint. Mild progression of the osteoarthritis of the L2 and L3 facet joints are noted.   L2-3 and L3-4 demonstrate moderate degree of central spinal canal stenosis. Central spinal canal stenosis shows mild progression at L2-3 since preceding exam.   The degrees of neural foramen narrowing are noted in the lumbar spine some of which are progressive since preceding exam as discussed above    Patient Active Problem List   Diagnosis Date Noted   Chronic pulmonary embolism ([place]) 07/24/2018   History of spinal fusion 05/21/2018   Essential hypertension 01/22/2018   Morbid obesity with body mass index of 50.0-59.9 in adult [facility]) 01/22/2018   Pre-diabetes 01/22/2018   Intramural leiomyoma of uterus 11/30/2017   Iron deficiency anemia due to chronic blood loss 09/25/2017   Low protein C activity level ([place]) 09/25/2017   Multiple sclerosis ([place]) 04/15/2017   Vitamin D deficiency 04/15/2017   Low back pain radiating to both legs 07/28/2015   History of colonic polyps 02/12/2014    Personal history of other venous thrombosis and embolism 01/03/2013   History of deep venous thrombosis 01/03/2013   Bilateral leg weakness 11/20/2012   Hyperlipidemia with target LDL less than 100 10/27/2011   Presbyopia 05/10/2011   Microalbuminuria 04/22/2011   Obstructive sleep apnea 01/15/2011    Past Surgical History:  Procedure Laterality Date   COLONOSCOPY  2017   Found 1 polyp-[place]    Family History  Problem Relation Age of Onset   Heart attack Mother    Ulcers Mother  Legs   Hypertension Mother    COPD Father        Tobacco user   Diabetes Mellitus II Father        Later on in life   Cataracts Father    Stroke Sister    Anxiety disorder Sister    Drug abuse Brother    Alcohol abuse Brother    Hypertension Sister    Anxiety disorder Sister     Social History   Tobacco Use   Smoking status: Never   Smokeless tobacco: Never  Substance Use Topics   Alcohol use: Yes    Comment: occasionally     Current Outpatient Medications:    acetaminophen (TYLENOL) 650 MG CR tablet, Take 650 mg by mouth every 8 (eight) hours as needed for pain., Disp: , Rfl:    apixaban (ELIQUIS) 5 MG TABS tablet, Take 1 tablet (5 mg total) by mouth 2 (two) times daily., Disp: 180 tablet, Rfl: 1   DULoxetine (CYMBALTA) 30 MG capsule, Take 3 capsules (90 mg total) by mouth daily., Disp: 270 capsule, Rfl: 1   leuprolide (LUPRON) 11.25 MG KIT injection, Inject into the muscle., Disp: , Rfl:    losartan (COZAAR) 50 MG tablet, Take 1 tablet (50 mg total) by mouth daily., Disp: 90 tablet, Rfl: 1   ergocalciferol (VITAMIN D2) 1.25 MG (50000 UT) capsule, Take by mouth. (Patient not taking: Reported on 09/03/2021), Disp: , Rfl:   Allergies  Allergen Reactions   Gabapentin     Tic disorder   Lyrica [Pregabalin]     Swelling and leg heaviness that causes gait difficulties    Metformin And Related     Not sure   Amoxicillin-Pot Clavulanate Nausea And Vomiting    I personally  reviewed active problem list, medication list, allergies, family history, social history, health maintenance with the patient/caregiver today.   ROS  Constitutional: Negative for fever or weight change.  Respiratory: Negative for cough and shortness of breath.   Cardiovascular: Negative for chest pain or palpitations.  Gastrointestinal: Negative for abdominal pain, no bowel changes.  Musculoskeletal: Negative for gait problem or joint swelling.  Skin: Negative for rash.  Neurological: Negative for dizziness or headache.  No other specific complaints in a complete review of systems (except as listed in HPI above).   Objective  Vitals:   09/03/21 0936  BP: 134/86  Pulse: 87  Resp: 16  Temp: 97.7 F (36.5 C)  Weight: (!) 339 lb (153.8 kg)  Height: '5\' 9"'  (1.753 m)    Body mass index is 50.06 kg/m.  Physical Exam  Constitutional: Patient appears well-developed and well-nourished. Obese  No distress.  HEENT: head atraumatic, normocephalic, pupils equal and reactive to light, neck supple, throat within normal limits Cardiovascular: Normal rate, regular rhythm and normal heart sounds.  No murmur heard. No BLE edema. Pulmonary/Chest: Effort normal and breath sounds normal. No respiratory distress. Abdominal: Soft.  There is no tenderness. Psychiatric: Patient has a normal mood and affect. behavior is normal. Judgment and thought content normal.   PHQ2/9: Depression screen [facility] 2/9 09/03/2021 03/03/2021 01/27/2021 10/08/2020 09/08/2020  Decreased Interest 0 3 0 3 3  Down, Depressed, Hopeless 0 0 3 0 3  PHQ - 2 Score 0 '3 3 3 6  ' Altered sleeping 0 3 0 0 3  Tired, decreased energy 0 '3 3 3 3  ' Change in appetite 0 0 0 1 0  Feeling bad or failure about yourself  0 0 0 0 3  Trouble concentrating 0 0 0 0 0  Moving slowly or fidgety/restless 0 0 0 0 0  Suicidal thoughts 0 0 0 0 0  PHQ-9 Score 0 '9 6 7 15  ' Difficult doing work/chores - - - Extremely dIfficult Very difficult  Some recent  data might be hidden    phq 9 is negative   Fall Risk: Fall Risk  09/03/2021 03/03/2021 01/27/2021 10/08/2020 06/25/2020  Falls in the past year? 0 '1 1 1 ' 0  Number falls in past yr: 0 0 0 0 0  Injury with Fall? 0 0 0 0 0  Comment - - - - -  Risk for fall due to : Impaired balance/gait;Impaired mobility - - Impaired balance/gait;History of fall(s) -  Follow up Falls prevention discussed - - - -     Functional Status Survey: Is the patient deaf or have difficulty hearing?: No Does the patient have difficulty seeing, even when wearing glasses/contacts?: No Does the patient have difficulty concentrating, remembering, or making decisions?: No Does the patient have difficulty walking or climbing stairs?: Yes Does the patient have difficulty dressing or bathing?: No Does the patient have difficulty doing errands alone such as visiting a doctor's office or shopping?: No    Assessment & Plan  1. Multiple sclerosis ([place])   2. Morbid obesity ([place])  Discussed with the patient the risk posed by an increased BMI. Discussed importance of portion control, calorie counting and at least 150 minutes of physical activity weekly. Avoid sweet beverages and drink more water. Eat at least 6 servings of fruit and vegetables daily   Advised to stop drinking sweet beverages and start getting calories from food  3. 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  Dispense: 180 tablet; Refill: 1  4. Low protein C activity level (HCC)   5. History of deep venous thrombosis (DVT) of distal vein of left lower extremity   6. Essential hypertension  - losartan (COZAAR) 50 MG tablet; Take 1 tablet (50 mg total) by mouth daily.  Dispense: 90 tablet; Refill: 1 - CBC with Differential/Platelet - COMPLETE METABOLIC PANEL WITH GFR  7. DDD (degenerative disc disease), lumbosacral   8.  Chronic pain syndrome   9. Dysthymia  - DULoxetine (CYMBALTA) 60 MG capsule; Take 1 capsule (60 mg total) by mouth daily.  Dispense: 90 capsule; Refill: 1  10. Adrenal mass, left (HCC)  Likely benign , stable  11. Vitamin D deficiency  - ergocalciferol (VITAMIN D2) 1.25 MG (50000 UT) capsule; Take 1 capsule (50,000 Units total) by mouth once a week.  Dispense: 12 capsule; Refill: 0 - VITAMIN D 25 Hydroxy (Vit-D Deficiency, Fractures)  12. Dyslipidemia  - Lipid panel  13. Bilateral lower extremity edema   14. History of neck surgery   15. Microalbuminuria  - Microalbumin / creatinine urine ratio  16. Insulin resistance  - Hemoglobin A1c  17. Other fatigue  It may be secondary to not using her CPAP machine and poor sleep hygiene - CBC with Differential/Platelet - COMPLETE METABOLIC PANEL WITH GFR - VITAMIN D 25 Hydroxy (Vit-D Deficiency, Fractures) - TSH - Vitamin B12

## 2021-09-04 LAB — COMPLETE METABOLIC PANEL WITH GFR
AG Ratio: 1.5 (calc) (ref 1.0–2.5)
ALT: 14 U/L (ref 6–29)
AST: 14 U/L (ref 10–35)
Albumin: 4.1 g/dL (ref 3.6–5.1)
Alkaline phosphatase (APISO): 110 U/L (ref 37–153)
BUN: 18 mg/dL (ref 7–25)
CO2: 34 mmol/L — ABNORMAL HIGH (ref 20–32)
Calcium: 9.3 mg/dL (ref 8.6–10.4)
Chloride: 105 mmol/L (ref 98–110)
Creat: 1.03 mg/dL (ref 0.50–1.03)
Globulin: 2.8 g/dL (calc) (ref 1.9–3.7)
Glucose, Bld: 96 mg/dL (ref 65–99)
Potassium: 4.6 mmol/L (ref 3.5–5.3)
Sodium: 144 mmol/L (ref 135–146)
Total Bilirubin: 0.6 mg/dL (ref 0.2–1.2)
Total Protein: 6.9 g/dL (ref 6.1–8.1)
eGFR: 63 mL/min/{1.73_m2} (ref >=60)

## 2021-09-04 LAB — LIPID PANEL
Cholesterol: 238 mg/dL — ABNORMAL HIGH (ref <200)
HDL: 41 mg/dL — ABNORMAL LOW (ref >=50)
LDL Cholesterol (Calc): 163 mg/dL (calc) — ABNORMAL HIGH
Non-HDL Cholesterol (Calc): 197 mg/dL (calc) — ABNORMAL HIGH (ref <130)
Total CHOL/HDL Ratio: 5.8 (calc) — ABNORMAL HIGH (ref <5.0)
Triglycerides: 185 mg/dL — ABNORMAL HIGH (ref <150)

## 2021-09-04 LAB — CBC WITH DIFFERENTIAL/PLATELET
Absolute Monocytes: 506 cells/uL (ref 200–950)
Basophils Absolute: 50 cells/uL (ref 0–200)
Basophils Relative: 0.9 %
Eosinophils Absolute: 88 cells/uL (ref 15–500)
Eosinophils Relative: 1.6 %
HCT: 45.6 % — ABNORMAL HIGH (ref 35.0–45.0)
Hemoglobin: 15.1 g/dL (ref 11.7–15.5)
Lymphs Abs: 1408 cells/uL (ref 850–3900)
MCH: 29.9 pg (ref 27.0–33.0)
MCHC: 33.1 g/dL (ref 32.0–36.0)
MCV: 90.3 fL (ref 80.0–100.0)
MPV: 10.8 fL (ref 7.5–12.5)
Monocytes Relative: 9.2 %
Neutro Abs: 3449 cells/uL (ref 1500–7800)
Neutrophils Relative %: 62.7 %
Platelets: 256 10*3/uL (ref 140–400)
RBC: 5.05 10*6/uL (ref 3.80–5.10)
RDW: 14 % (ref 11.0–15.0)
Total Lymphocyte: 25.6 %
WBC: 5.5 10*3/uL (ref 3.8–10.8)

## 2021-09-04 LAB — VITAMIN B12: Vitamin B-12: 531 pg/mL (ref 200–1100)

## 2021-09-04 LAB — HEMOGLOBIN A1C
Hgb A1c MFr Bld: 5.9 % of total Hgb — ABNORMAL HIGH (ref <5.7)
Mean Plasma Glucose: 123 mg/dL
eAG (mmol/L): 6.8 mmol/L

## 2021-09-04 LAB — MICROALBUMIN / CREATININE URINE RATIO
Creatinine, Urine: 228 mg/dL (ref 20–275)
Microalb Creat Ratio: 146 mcg/mg creat — ABNORMAL HIGH (ref <30)
Microalb, Ur: 33.3 mg/dL

## 2021-09-04 LAB — TSH: TSH: 2.61 mIU/L (ref 0.40–4.50)

## 2021-09-04 LAB — VITAMIN D 25 HYDROXY (VIT D DEFICIENCY, FRACTURES): Vit D, 25-Hydroxy: 31 ng/mL (ref 30–100)

## 2021-09-20 NOTE — Telephone Encounter (Signed)
Awaiting signature from Dr. Ancil Boozer. Will update patient when completed

## 2021-10-04 ENCOUNTER — Telehealth: Payer: Self-pay | Admitting: Family Medicine

## 2021-10-04 NOTE — Telephone Encounter (Signed)
Patient called in about having disability paperwork filled out,, has to have by 02/25

## 2021-10-04 NOTE — Telephone Encounter (Signed)
Faxed on February 6th.

## 2021-10-05 ENCOUNTER — Encounter: Payer: Self-pay | Admitting: Family Medicine

## 2021-10-07 ENCOUNTER — Ambulatory Visit (INDEPENDENT_AMBULATORY_CARE_PROVIDER_SITE_OTHER): Payer: BC Managed Care – PPO | Admitting: Family Medicine

## 2021-10-07 ENCOUNTER — Other Ambulatory Visit: Payer: Self-pay

## 2021-10-07 ENCOUNTER — Encounter: Payer: Self-pay | Admitting: Family Medicine

## 2021-10-07 VITALS — BP 130/80 | HR 89 | Resp 16 | Ht 69.0 in | Wt 340.0 lb

## 2021-10-07 DIAGNOSIS — G894 Chronic pain syndrome: Secondary | ICD-10-CM | POA: Diagnosis not present

## 2021-10-07 DIAGNOSIS — M5137 Other intervertebral disc degeneration, lumbosacral region: Secondary | ICD-10-CM | POA: Diagnosis not present

## 2021-10-07 NOTE — Progress Notes (Signed)
Name: Morgan Craig   MRN: [id number]    DOB: 01/16/1961   Date:10/07/2021       Progress Note  Subjective  Chief Complaint  Extended STD Paperwork  HPI  Chronic back pain: she was  under the care of pain clinic at [facility]. She has back pain for the over 3   years from DDD lumbar spine. She continues to have daily pain and inability to walk for over a few feet without stopping . She  took a leave of absence Nov 1st 2021 until Nov 29 th, 2021. She had nerve ablation in Nov and she went back to work in December 21  but states pain didn't improve much She stopped going to work again  on January 17 th, 2022, she states she stopped going to work because of an URI possible COVID-19. She never went back due to increase in back pain. She states that since January 22  she is not driving, her sister has to do her grocery shopping, she had to change her shower from bathtub to a shower with a chair since unable to stand long for a shower, she is able to cook but she needs to take  multiple breaks while cooking , she has not been able to clean her house, sister [name] helps her with laundry. She used to be able to use a cane but now only able to walk inside the house without assistance device, and when out of the house needs to use a walker  She went to [facility] Feb 2022 to see pain clinic and psychologist. Based on notes she is not a good candidate to chronic opioid therapy, she was advised to have Aqua therapy, advised Lyrica and Topamax but she refused medications and has not been back to pain clinic and cancelled appointment with therapist because she states pain medication is what would help her , since therapist did not approve her for opioids she decided not to go back. Explained that they were going to help her by doing CBT and help her lose weight.  Today she told me she tried lyrica in the past and think it caused her to swell up .  Discussed consider chiropractor care again-  she went in the past  . She had PT  in the past and suggested her to go back but she states she cannot afford it at this time. She states symptoms are worse when walking or standing, no pain when sitting I asked about a remote position and she said [employer] did not offer that and she would have to re-apply without guarantees that she would get the position. 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 L1-2 demonstrate significant osteoarthritis of the left facet joint. Mild progression of the osteoarthritis of the L2 and L3 facet joints are noted.   L2-3 and L3-4 demonstrate moderate degree of central spinal canal stenosis. Central spinal canal stenosis shows mild progression at L2-3 since preceding exam.   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   Chronic pulmonary embolism ([place]) 07/24/2018   History of spinal fusion 05/21/2018   Essential hypertension 01/22/2018  Morbid obesity with body mass index of 50.0-59.9 in adult [facility]) 01/22/2018   Pre-diabetes 01/22/2018   Intramural leiomyoma of uterus 11/30/2017   Iron deficiency anemia due to chronic blood loss 09/25/2017   Low protein C activity level ([place]) 09/25/2017   Multiple sclerosis ([place]) 04/15/2017   Vitamin D deficiency 04/15/2017   Low back pain radiating to both legs 07/28/2015   History of colonic polyps 02/12/2014   Personal history of other venous thrombosis and embolism 01/03/2013   History of deep venous thrombosis 01/03/2013   Bilateral leg weakness 11/20/2012   Hyperlipidemia with target LDL less than 100 10/27/2011   Presbyopia 05/10/2011   Microalbuminuria 04/22/2011   Obstructive sleep apnea 01/15/2011    Past Surgical  History:  Procedure Laterality Date   COLONOSCOPY  2017   Found 1 polyp-[place]    Family History  Problem Relation Age of Onset   Heart attack Mother    Ulcers Mother        Legs   Hypertension Mother    COPD Father        Tobacco user   Diabetes Mellitus II Father        Later on in life   Cataracts Father    Stroke Sister    Anxiety disorder Sister    Drug abuse Brother    Alcohol abuse Brother    Hypertension Sister    Anxiety disorder Sister     Social History   Tobacco Use   Smoking status: Never   Smokeless tobacco: Never  Substance Use Topics   Alcohol use: Yes    Comment: occasionally     Current Outpatient Medications:    acetaminophen (TYLENOL) 650 MG CR tablet, Take 650 mg by mouth every 8 (eight) hours as needed for pain., Disp: , Rfl:    apixaban (ELIQUIS) 5 MG TABS tablet, Take 1 tablet (5 mg total) by mouth 2 (two) times daily., Disp: 180 tablet, Rfl: 1   DULoxetine (CYMBALTA) 60 MG capsule, Take 1 capsule (60 mg total) by mouth daily., Disp: 90 capsule, Rfl: 1   ergocalciferol (VITAMIN D2) 1.25 MG (50000 UT) capsule, Take 1 capsule (50,000 Units total) by mouth once a week., Disp: 12 capsule, Rfl: 0   losartan (COZAAR) 50 MG tablet, Take 1 tablet (50 mg total) by mouth daily., Disp: 90 tablet, Rfl: 1  Allergies  Allergen Reactions   Gabapentin     Tic disorder   Lyrica [Pregabalin]     Swelling and leg heaviness that causes gait difficulties    Metformin And Related     Not sure   Amoxicillin-Pot Clavulanate Nausea And Vomiting    I personally reviewed active problem list, medication list, allergies, family history, social history, health maintenance with the patient/caregiver today.   ROS  Ten systems reviewed and is negative except as mentioned in HPI   Objective  Vitals:   10/07/21 1044  BP: 130/80  Pulse: 89  Resp: 16  SpO2: 98%  Weight: (!) 340 lb (154.2 kg)  Height: _0  (1.753 m)    Body mass index is 50.21  kg/m.  Physical Exam  Constitutional: Patient appears well-developed and well-nourished. Obese  No distress.  HEENT: head atraumatic, normocephalic, pupils equal and reactive to light, neck supple Cardiovascular: Normal rate, regular rhythm and normal heart sounds.  No murmur heard. Trace  BLE edema. Pulmonary/Chest: Effort normal and breath sounds normal. No respiratory distress. Abdominal: Soft.  There is no tenderness. Psychiatric: Patient has a  normal mood and affect. behavior is normal. Judgment and thought content normal.  Muscular skeletal: slow gait, using walker, no pain during palpation of lumbar spine   Recent Results (from the past 2160 hour(s))  Lipid panel     Status: Abnormal   Collection Time: 09/03/21 10:43 AM  Result Value Ref Range   Cholesterol 238 (H) <200 mg/dL   HDL 41 (L) > OR = 50 mg/dL   Triglycerides 185 (H) <150 mg/dL   LDL Cholesterol (Calc) 163 (H) mg/dL (calc)    Comment: Reference range: <100 . Desirable range <100 mg/dL for primary prevention;   <70 mg/dL for patients with CHD or diabetic patients  with > or = 2 CHD risk factors. Marland Kitchen LDL-C is now calculated using the Martin-Hopkins  calculation, which is a validated novel method providing  better accuracy than the Friedewald equation in the  estimation of LDL-C.  Cresenciano Genre et al. Annamaria Helling. 2119;417(40): 2061-2068  (http://education.[facility].com/faq/FAQ164)    Total CHOL/HDL Ratio 5.8 (H) <5.0 (calc)   Non-HDL Cholesterol (Calc) 197 (H) <130 mg/dL (calc)    Comment: For patients with diabetes plus 1 major ASCVD risk  factor, treating to a non-HDL-C goal of <100 mg/dL  (LDL-C of <70 mg/dL) is considered a therapeutic  option.   CBC with Differential/Platelet     Status: Abnormal   Collection Time: 09/03/21 10:43 AM  Result Value Ref Range   WBC 5.5 3.8 - 10.8 Thousand/uL   RBC 5.05 3.80 - 5.10 Million/uL   Hemoglobin 15.1 11.7 - 15.5 g/dL   HCT 45.6 (H) 35.0 - 45.0 %   MCV 90.3 80.0 -  100.0 fL   MCH 29.9 27.0 - 33.0 pg   MCHC 33.1 32.0 - 36.0 g/dL   RDW 14.0 11.0 - 15.0 %   Platelets 256 140 - 400 Thousand/uL   MPV 10.8 7.5 - 12.5 fL   Neutro Abs 3,449 1,500 - 7,800 cells/uL   Lymphs Abs 1,408 850 - 3,900 cells/uL   Absolute Monocytes 506 200 - 950 cells/uL   Eosinophils Absolute 88 15 - 500 cells/uL   Basophils Absolute 50 0 - 200 cells/uL   Neutrophils Relative % 62.7 %   Total Lymphocyte 25.6 %   Monocytes Relative 9.2 %   Eosinophils Relative 1.6 %   Basophils Relative 0.9 %  COMPLETE METABOLIC PANEL WITH GFR     Status: Abnormal   Collection Time: 09/03/21 10:43 AM  Result Value Ref Range   Glucose, Bld 96 65 - 99 mg/dL    Comment: .            Fasting reference interval .    BUN 18 7 - 25 mg/dL   Creat 1.03 0.50 - 1.03 mg/dL   eGFR 63 > OR = 60 mL/min/1.22m    Comment: The eGFR is based on the CKD-EPI 2021 equation. To calculate  the new eGFR from a previous Creatinine or Cystatin C result, go to https://www.kidney.org/professionals/ kdoqi/gfr%5Fcalculator    BUN/Creatinine Ratio NOT APPLICABLE 6 - 22 (calc)   Sodium 144 135 - 146 mmol/L   Potassium 4.6 3.5 - 5.3 mmol/L   Chloride 105 98 - 110 mmol/L   CO2 34 (H) 20 - 32 mmol/L   Calcium 9.3 8.6 - 10.4 mg/dL   Total Protein 6.9 6.1 - 8.1 g/dL   Albumin 4.1 3.6 - 5.1 g/dL   Globulin 2.8 1.9 - 3.7 g/dL (calc)   AG Ratio 1.5 1.0 - 2.5 (calc)   Total Bilirubin  0.6 0.2 - 1.2 mg/dL   Alkaline phosphatase (APISO) 110 37 - 153 U/L   AST 14 10 - 35 U/L   ALT 14 6 - 29 U/L  Hemoglobin A1c     Status: Abnormal   Collection Time: 09/03/21 10:43 AM  Result Value Ref Range   Hgb A1c MFr Bld 5.9 (H) <5.7 % of total Hgb    Comment: For someone without known diabetes, a hemoglobin  A1c value between 5.7% and 6.4% is consistent with prediabetes and should be confirmed with a  follow-up test. . For someone with known diabetes, a value <7% indicates that their diabetes is well controlled. A1c targets  should be individualized based on duration of diabetes, age, comorbid conditions, and other considerations. . This assay result is consistent with an increased risk of diabetes. . Currently, no consensus exists regarding use of hemoglobin A1c for diagnosis of diabetes for children. .    Mean Plasma Glucose 123 mg/dL   eAG (mmol/L) 6.8 mmol/L  VITAMIN D 25 Hydroxy (Vit-D Deficiency, Fractures)     Status: None   Collection Time: 09/03/21 10:43 AM  Result Value Ref Range   Vit D, 25-Hydroxy 31 30 - 100 ng/mL    Comment: Vitamin D Status         25-OH Vitamin D: . Deficiency:                    <20 ng/mL Insufficiency:             20 - 29 ng/mL Optimal:                 > or = 30 ng/mL . For 25-OH Vitamin D testing on patients on  D2-supplementation and patients for whom quantitation  of D2 and D3 fractions is required, the [facility](TM) 25-OH VIT D, (D2,D3), LC/MS/MS is recommended: order  code [phone] (patients >24yr). See Note 1 . Note 1 . For additional information, please refer to  http://education.[facility].com/faq/FAQ199  (This link is being provided for informational/ educational purposes only.)   TSH     Status: None   Collection Time: 09/03/21 10:43 AM  Result Value Ref Range   TSH 2.61 0.40 - 4.50 mIU/L  Vitamin B12     Status: None   Collection Time: 09/03/21 10:43 AM  Result Value Ref Range   Vitamin B-12 531 200 - 1,100 pg/mL  Microalbumin / creatinine urine ratio     Status: Abnormal   Collection Time: 09/03/21 10:43 AM  Result Value Ref Range   Creatinine, Urine 228 20 - 275 mg/dL   Microalb, Ur 33.3 mg/dL    Comment: Verified by repeat analysis. .Marland KitchenReference Range Not established    Microalb Creat Ratio 146 (H) <30 mcg/mg creat    Comment: . The ADA defines abnormalities in albumin excretion as follows: .Marland KitchenAlbuminuria Category        Result (mcg/mg creatinine) . Normal to Mildly increased   <30 Moderately increased         30-299   Severely increased           > OR = 300 . The ADA recommends that at least two of three specimens collected within a 3-6 month period be abnormal before considering a patient to be within a diagnostic category.     [facility] 10/07/2021 09/03/2021 03/03/2021 01/27/2021 10/08/2020  Decreased Interest 0 0 3 0 3  Down, Depressed, Hopeless 0 0 0 3 0  PHQ - 2 Score 0 0 _0 Altered sleeping 0 0 3 0 0  Tired, decreased energy 0 0 _1 Change in appetite 0 0 0 0 1  Feeling bad or failure about yourself  0 0 0 0 0  Trouble concentrating 0 0 0 0 0  Moving slowly or fidgety/restless 0 0 0 0 0  Suicidal thoughts 0 0 0 0 0  PHQ-9 Score 0 0 _2 Difficult doing work/chores - - - - Extremely dIfficult  Some recent data might be hidden    phq 9 is negative   Fall Risk: Fall Risk  10/07/2021 09/03/2021 03/03/2021 01/27/2021 10/08/2020  Falls in the past year? 0 0 _3 Number falls in past yr: 0 0 0 0 0  Injury with Fall? 0 0 0 0 0  Comment - - - - -  Risk for fall due to : Impaired balance/gait;Impaired mobility Impaired balance/gait;Impaired mobility - - Impaired balance/gait;History of fall(s)  Follow up Falls prevention discussed Falls prevention discussed - - -      Functional Status Survey: Is the patient deaf or have difficulty hearing?: No Does the patient have difficulty seeing, even when wearing glasses/contacts?: No Does the patient have difficulty concentrating, remembering, or making decisions?: No Does the patient have difficulty walking or climbing stairs?: Yes Does the patient have difficulty dressing or bathing?: Yes Does the patient have difficulty doing errands alone such as visiting a doctor's office or shopping?: Yes    Assessment & Plan  1. Chronic pain syndrome   2. DDD (degenerative disc disease), lumbosacral  Disability forms filled out and will be faxed today

## 2021-10-18 NOTE — Telephone Encounter (Signed)
Awaiting signature from Dr. Ancil Boozer and will fax.  ?

## 2021-12-13 ENCOUNTER — Telehealth: Payer: Self-pay

## 2021-12-13 NOTE — Telephone Encounter (Signed)
Copied from [place] [id number]. Topic: General - Other ?>> Dec 13, 2021 12:27 PM Pawlus, Brayton Layman A wrote: ?Reason for CRM: Pt called in asking if Dr Ancil Boozer would be able to complete some paperwork for disability, please advise. ?

## 2021-12-13 NOTE — Telephone Encounter (Signed)
Called patient, she is going to drop off paperwork as she states it is [employer]-insurance forms not actual disability/lawyer related claim. ?

## 2021-12-19 ENCOUNTER — Encounter: Payer: Self-pay | Admitting: Family Medicine

## 2022-01-04 ENCOUNTER — Encounter: Payer: BC Managed Care – PPO | Admitting: Family Medicine

## 2022-01-08 ENCOUNTER — Encounter: Payer: Self-pay | Admitting: Family Medicine

## 2022-03-24 ENCOUNTER — Other Ambulatory Visit: Payer: Self-pay | Admitting: Family Medicine

## 2022-03-24 DIAGNOSIS — I10 Essential (primary) hypertension: Secondary | ICD-10-CM

## 2022-04-19 ENCOUNTER — Encounter: Payer: Self-pay | Admitting: Family Medicine

## 2022-07-02 IMAGING — CR DG CHEST 2V
1 series · 2 of 2 positions shown · non-contrast
Comparison: None.

CLINICAL DATA: Cough, short of breath

EXAM:
CHEST - 2 VIEW

[Series 1: dg chest 2 view · 0.14mm/px · 2 of 2 slices shown]
[im 1/2]
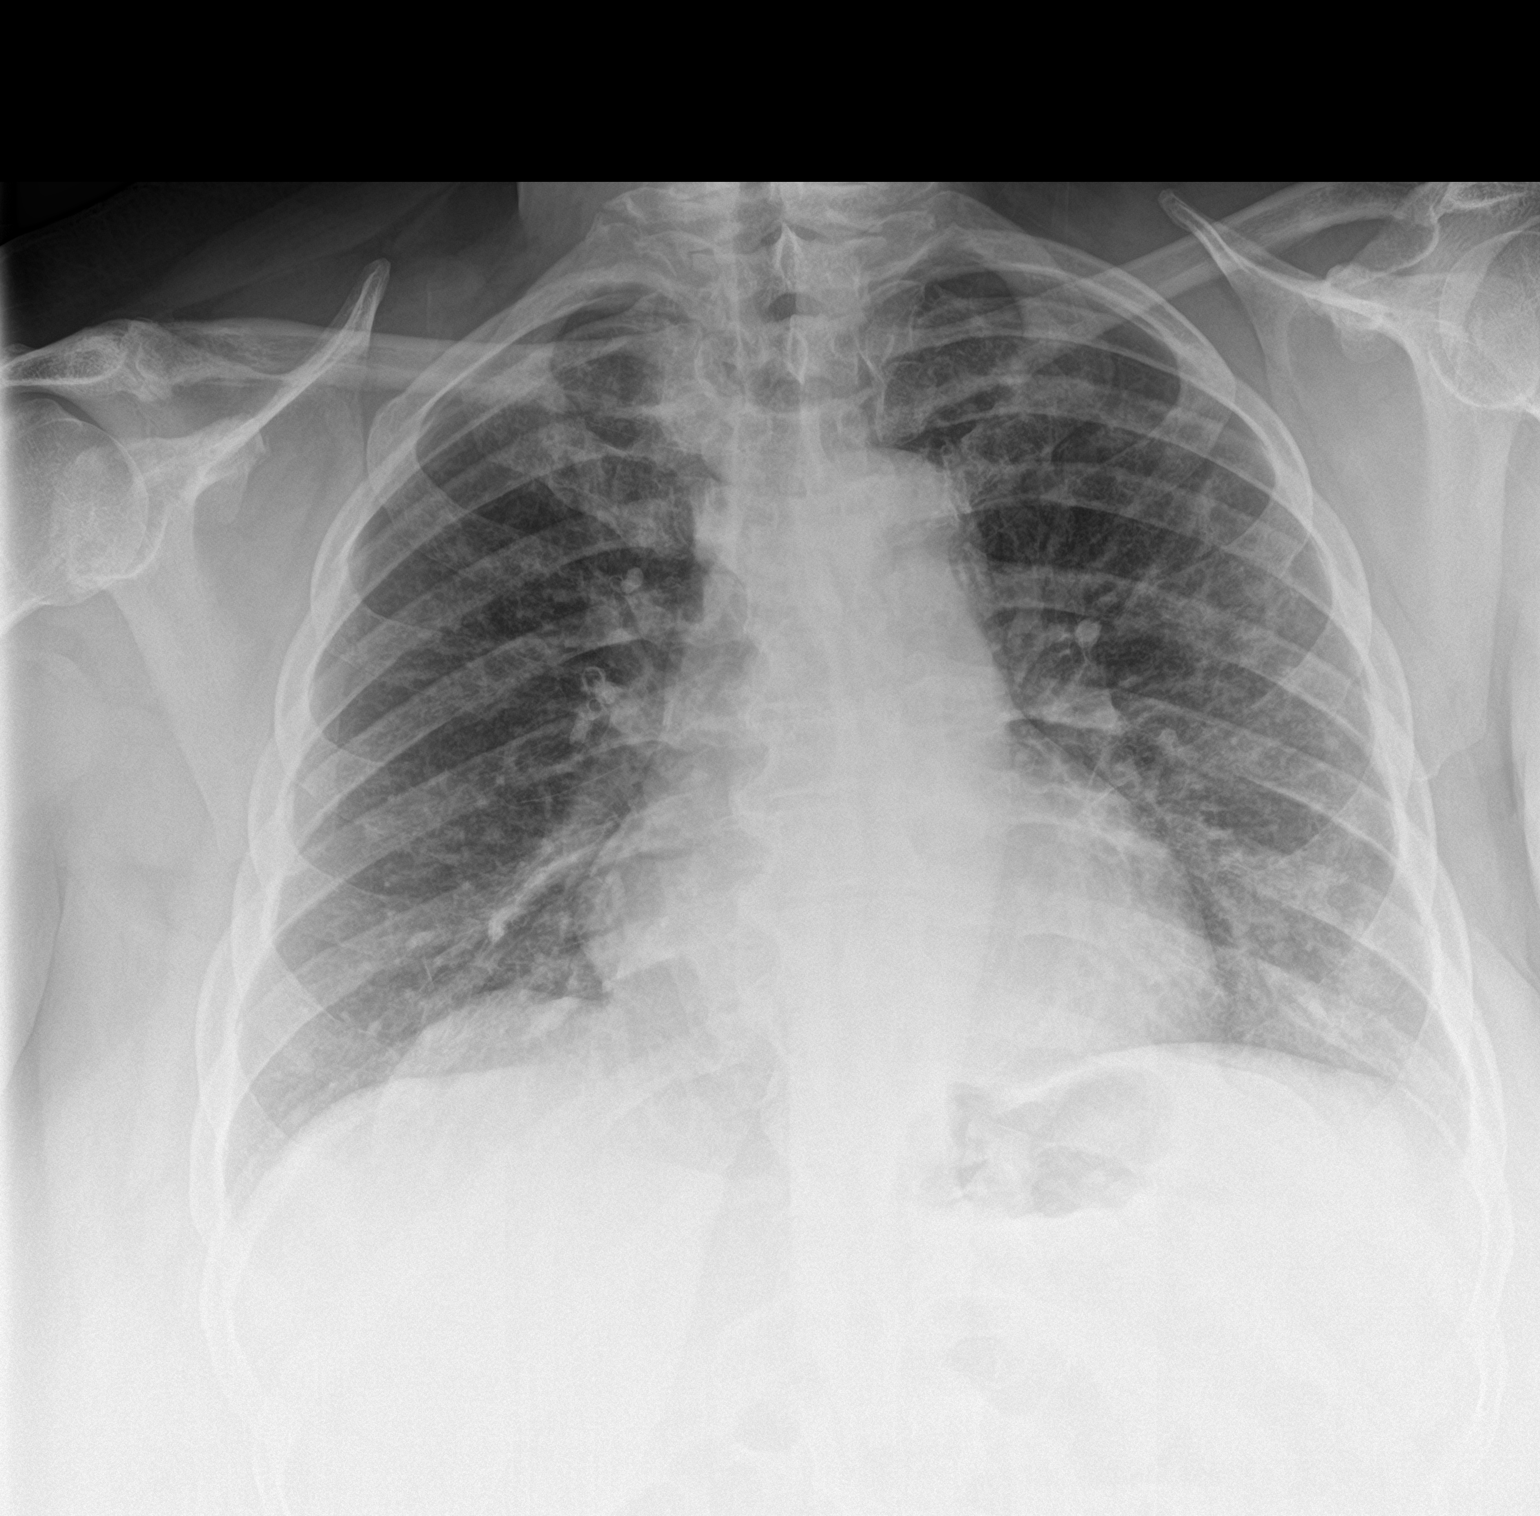
[im 2/2]
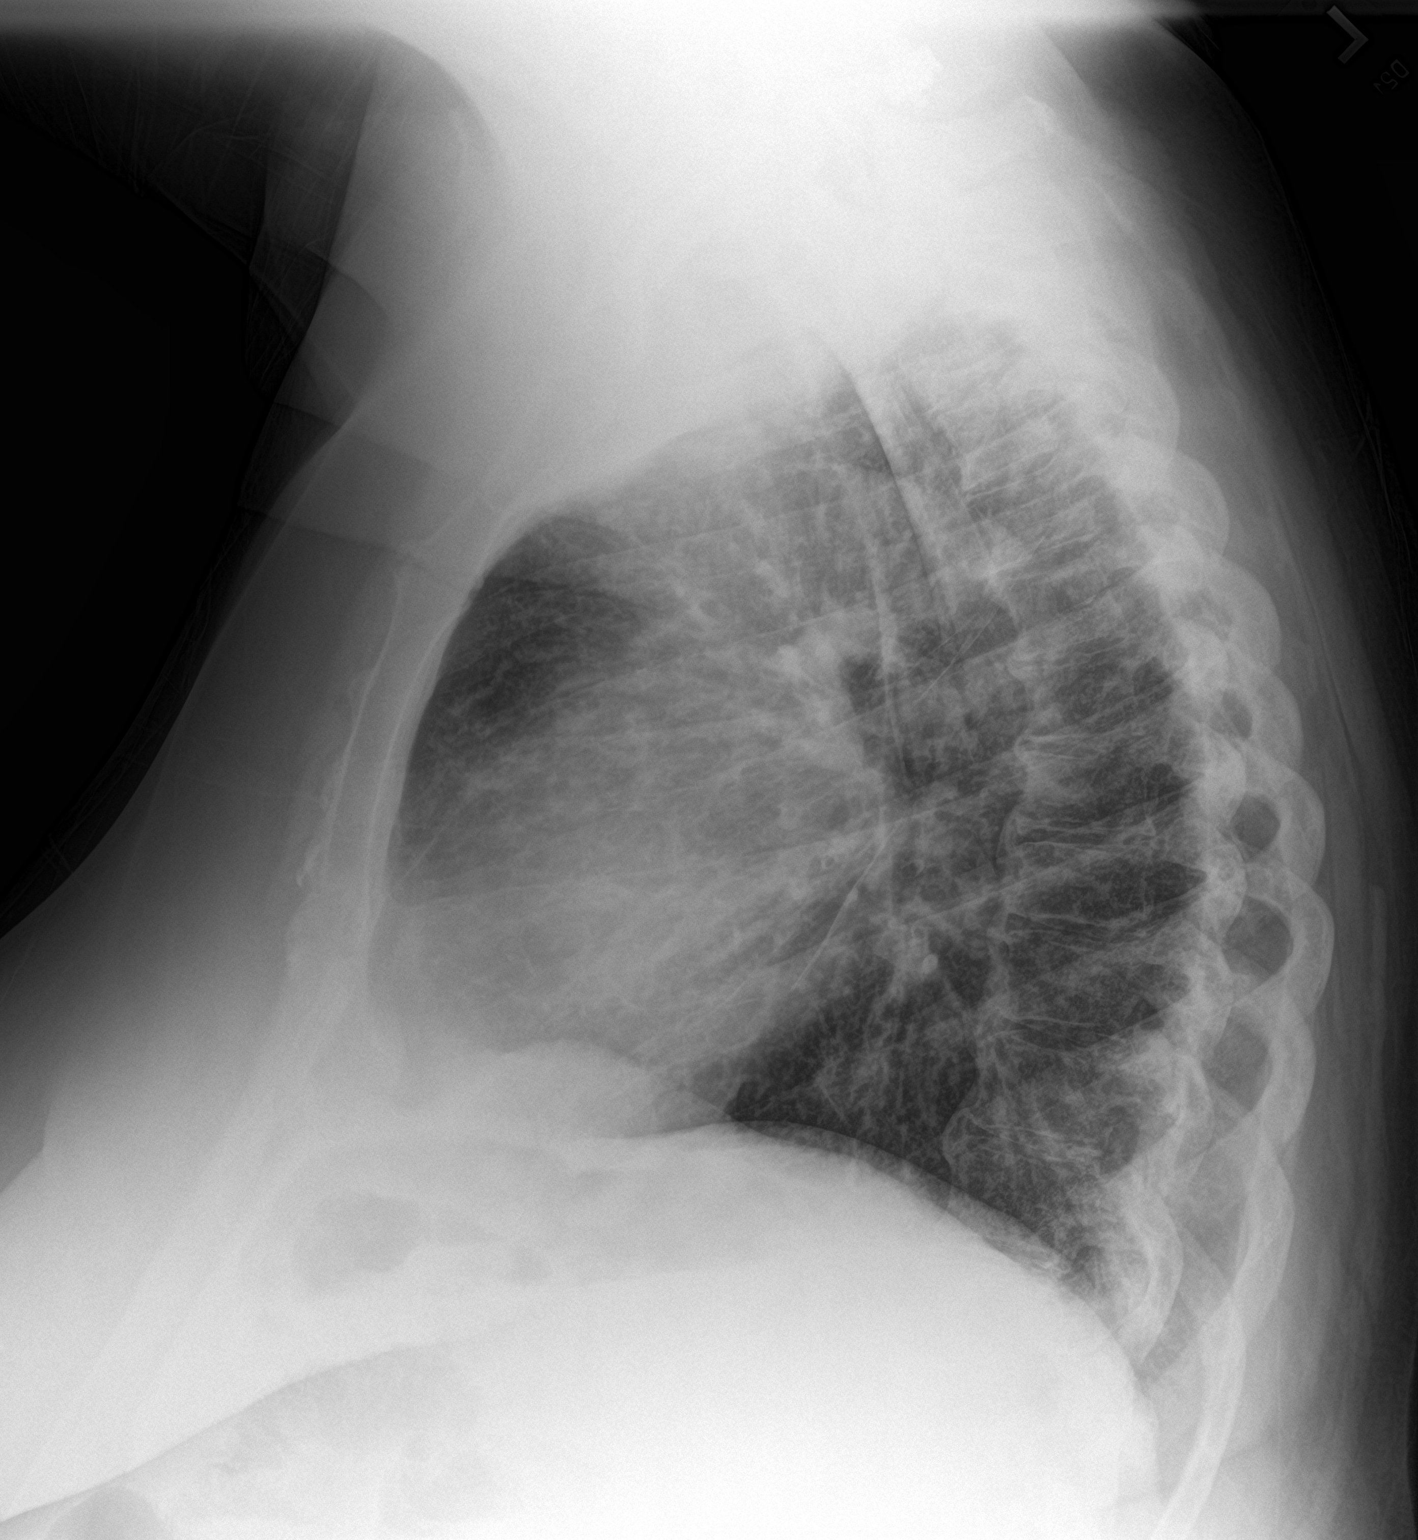

[2 of 2 positions shown; findings below may reference images not displayed]

FINDINGS: Frontal and lateral views of the chest demonstrate an unremarkable
cardiac silhouette. There is increased interstitial prominence, with
patchy areas of airspace disease most pronounced on the left. No
effusion or pneumothorax. No acute bony abnormalities.
IMPRESSION: 1. Mild left-sided ground-glass airspace disease, with diffuse
bilateral interstitial prominence. Favor mild multifocal pneumonia
over asymmetric edema. Please correlate with clinical presentation.
